# Patient Record
Sex: Male | Born: 1985 | Race: Black or African American | Hispanic: No | Marital: Single | Smoking: Current every day smoker
Health system: Southern US, Community
[De-identification: ages and names within clinical notes are randomized; demographics above are authoritative.]

## PROBLEM LIST (undated history)

## (undated) HISTORY — PX: LEG SURGERY: SHX1003

---
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
## 2007-07-26 ENCOUNTER — Emergency Department (HOSPITAL_COMMUNITY): Admission: EM | Admit: 2007-07-26 | Discharge: 2007-07-26 | Payer: Self-pay | Admitting: Family Medicine

## 2007-12-17 ENCOUNTER — Emergency Department (HOSPITAL_COMMUNITY): Admission: EM | Admit: 2007-12-17 | Discharge: 2007-12-17 | Payer: Self-pay | Admitting: Emergency Medicine

## 2009-01-16 ENCOUNTER — Emergency Department (HOSPITAL_COMMUNITY): Admission: EM | Admit: 2009-01-16 | Discharge: 2009-01-16 | Payer: Self-pay | Admitting: Family Medicine

## 2009-05-11 ENCOUNTER — Emergency Department (HOSPITAL_COMMUNITY): Admission: EM | Admit: 2009-05-11 | Discharge: 2009-05-11 | Payer: Self-pay | Admitting: Emergency Medicine

## 2010-10-26 LAB — RPR: RPR Ser Ql: NONREACTIVE

## 2010-10-26 LAB — GC/CHLAMYDIA PROBE AMP, GENITAL
Chlamydia, DNA Probe: POSITIVE — AB
GC Probe Amp, Genital: NEGATIVE

## 2010-10-26 LAB — HIV ANTIBODY (ROUTINE TESTING W REFLEX): HIV: NONREACTIVE

## 2013-07-09 ENCOUNTER — Emergency Department (INDEPENDENT_AMBULATORY_CARE_PROVIDER_SITE_OTHER)
Admission: EM | Admit: 2013-07-09 | Discharge: 2013-07-09 | Disposition: A | Discharge status: Home / Self Care | Payer: Self-pay | Source: Home / Self Care | Attending: Family Medicine | Admitting: Family Medicine

## 2013-07-09 ENCOUNTER — Encounter (HOSPITAL_COMMUNITY): Payer: Self-pay | Admitting: Emergency Medicine

## 2013-07-09 DIAGNOSIS — J111 Influenza due to unidentified influenza virus with other respiratory manifestations: Secondary | ICD-10-CM

## 2013-07-09 NOTE — ED Provider Notes (Signed)
CSN: [id number]     Arrival date & time 07/09/13  1945 History   First MD Initiated Contact with Patient 07/09/13 2022     No chief complaint on file.  (Consider location/radiation/quality/duration/timing/severity/associated sxs/prior Treatment) Patient is a 27 y.o. male presenting with fever. The history is provided by the patient and a parent.  Fever Temp source:  Subjective Severity:  Moderate Duration:  2 days Progression:  Unchanged Chronicity:  New Associated symptoms: chills, congestion, cough, myalgias and rhinorrhea   Associated symptoms: no diarrhea, no dysuria, no nausea, no rash and no vomiting     No past medical history on file. No past surgical history on file. No family history on file. History  Substance Use Topics  . Smoking status: Not on file  . Smokeless tobacco: Not on file  . Alcohol Use: Not on file    Review of Systems  Constitutional: Positive for fever and chills.  HENT: Positive for congestion, postnasal drip and rhinorrhea.   Respiratory: Positive for cough.   Cardiovascular: Negative.   Gastrointestinal: Negative.  Negative for nausea, vomiting and diarrhea.  Genitourinary: Negative.  Negative for dysuria.  Musculoskeletal: Positive for myalgias.  Skin: Negative for rash.    Allergies  Review of patient's allergies indicates not on file.  Home Medications  No current outpatient prescriptions on file. BP 145/83  Pulse 90  Temp(Src) 99.8 F (37.7 C) (Oral)  Resp 16  SpO2 99% Physical Exam  Nursing note and vitals reviewed. Constitutional: He is oriented to person, place, and time. He appears well-developed and well-nourished.  HENT:  Head: Normocephalic.  Right Ear: External ear normal.  Left Ear: External ear normal.  Mouth/Throat: Oropharynx is clear and moist.  Eyes: Pupils are equal, round, and reactive to light.  Neck: Normal range of motion. Neck supple.  Cardiovascular: Normal rate, regular rhythm, normal heart sounds and  intact distal pulses.   Pulmonary/Chest: Effort normal and breath sounds normal.  Abdominal: Soft. Bowel sounds are normal.  Lymphadenopathy:    He has no cervical adenopathy.  Neurological: He is alert and oriented to person, place, and time.  Skin: Skin is warm and dry.    ED Course  Procedures (including critical care time) Labs Review Labs Reviewed - No data to display Imaging Review No results found.  EKG Interpretation    Date/Time:    Ventricular Rate:    PR Interval:    QRS Duration:   QT Interval:    QTC Calculation:   R Axis:     Text Interpretation:              MDM      Linna Hoff, MD 07/09/13 2038

## 2013-07-09 NOTE — ED Notes (Signed)
Fever  (101.4), upper respiratory infection symptoms for 1-2 days.

## 2013-10-19 ENCOUNTER — Encounter (HOSPITAL_COMMUNITY): Payer: Self-pay | Admitting: Emergency Medicine

## 2013-10-19 ENCOUNTER — Emergency Department (INDEPENDENT_AMBULATORY_CARE_PROVIDER_SITE_OTHER)
Admission: EM | Admit: 2013-10-19 | Discharge: 2013-10-19 | Disposition: A | Discharge status: Home / Self Care | Payer: Self-pay | Source: Home / Self Care | Attending: Emergency Medicine | Admitting: Emergency Medicine

## 2013-10-19 DIAGNOSIS — B353 Tinea pedis: Secondary | ICD-10-CM

## 2013-10-19 MED ORDER — CLOTRIMAZOLE 1 % EX CREA: TOPICAL_CREAM | CUTANEOUS | Status: DC

## 2013-10-19 NOTE — Discharge Instructions (Signed)
Athlete's Foot Athlete's foot (tinea pedis) is a fungal infection of the skin on the feet. It often occurs on the skin between the toes or underneath the toes. It can also occur on the soles of the feet. Athlete's foot is more likely to occur in hot, humid weather. Not washing your feet or changing your socks often enough can contribute to athlete's foot. The infection can spread from person to person (contagious). CAUSES Athlete's foot is caused by a fungus. This fungus thrives in warm, moist places. Most people get athlete's foot by sharing shower stalls, towels, and wet floors with an infected person. People with weakened immune systems, including those with diabetes, may be more likely to get athlete's foot. SYMPTOMS   Itchy areas between the toes or on the soles of the feet.  White, flaky, or scaly areas between the toes or on the soles of the feet.  Tiny, intensely itchy blisters between the toes or on the soles of the feet.  Tiny cuts on the skin. These cuts can develop a bacterial infection.  Thick or discolored toenails. DIAGNOSIS  Your caregiver can usually tell what the problem is by doing a physical exam. Your caregiver may also take a skin sample from the rash area. The skin sample may be examined under a microscope, or it may be tested to see if fungus will grow in the sample. A sample may also be taken from your toenail for testing. TREATMENT  Over-the-counter and prescription medicines can be used to kill the fungus. These medicines are available as powders or creams. Your caregiver can suggest medicines for you. Fungal infections respond slowly to treatment. You may need to continue using your medicine for several weeks. PREVENTION   Do not share towels.  Wear sandals in wet areas, such as shared locker rooms and shared showers.  Keep your feet dry. Wear shoes that allow air to circulate. Wear cotton or wool socks. HOME CARE INSTRUCTIONS   Take medicines as directed by  your caregiver. Do not use steroid creams on athlete's foot.  Keep your feet clean and cool. Wash your feet daily and dry them thoroughly, especially between your toes.  Change your socks every day. Wear cotton or wool socks. In hot climates, you may need to change your socks 2 to 3 times per day.  Wear sandals or canvas tennis shoes with good air circulation.  If you have blisters, soak your feet in Burow's solution or Epsom salts for 20 to 30 minutes, 2 times a day to dry out the blisters. Make sure you dry your feet thoroughly afterward. SEEK MEDICAL CARE IF:   You have a fever.  You have swelling, soreness, warmth, or redness in your foot.  You are not getting better after 7 days of treatment.  You are not completely cured after 30 days.  You have any problems caused by your medicines. MAKE SURE YOU:   Understand these instructions.  Will watch your condition.  Will get help right away if you are not doing well or get worse. Document Released: 07/06/2000 Document Revised: 10/01/2011 Document Reviewed: 04/27/2011 [employer] Patient Information 2014 [employer].  

## 2013-10-19 NOTE — ED Notes (Signed)
C/o  Rash on both feet with irritation x 1 wk.  Pt has used cortisone cream with no relief.

## 2013-10-19 NOTE — ED Provider Notes (Signed)
Medical screening examination/treatment/procedure(s) were performed by non-physician practitioner and as supervising physician I was immediately available for consultation/collaboration.  Jamecia Lerman, M.D.  Jla Reynolds C Aracelis Ulrey, MD 10/19/13 2215 

## 2013-10-19 NOTE — ED Provider Notes (Signed)
CSN: [id number]     Arrival date & time 10/19/13  1046 History   First MD Initiated Contact with Patient 10/19/13 1208     Chief Complaint  Patient presents with  . Rash   (Consider location/radiation/quality/duration/timing/severity/associated sxs/prior Treatment) Patient is a 28 y.o. male presenting with rash. The history is provided by the patient. No language interpreter was used.  Rash Location:  Foot Foot rash location:  L foot and R foot Quality: blistering, itchiness and redness   Severity:  Moderate Onset quality:  Gradual Timing:  Constant Progression:  Worsening Chronicity:  New Relieved by:  Nothing Worsened by:  Nothing tried Ineffective treatments:  None tried Pt complains of rash on feet and between toes,  Pt reports feet are wet a lot at work  History reviewed. No pertinent past medical history. Past Surgical History  Procedure Laterality Date  . Leg surgery     History reviewed. No pertinent family history. History  Substance Use Topics  . Smoking status: Current Every Day Smoker  . Smokeless tobacco: Not on file  . Alcohol Use: No    Review of Systems  Skin: Positive for rash.  All other systems reviewed and are negative.    Allergies  Review of patient's allergies indicates no known allergies.  Home Medications   Current Outpatient Rx  Name  Route  Sig  Dispense  Refill  . guaiFENesin (ROBITUSSIN) 100 MG/5ML liquid   Oral   Take 200 mg by mouth 3 (three) times daily as needed for cough.         Marland Kitchen. ibuprofen (ADVIL,MOTRIN) 200 MG tablet   Oral   Take 200 mg by mouth every 6 (six) hours as needed.          BP 115/79  Pulse 100  Temp(Src) 97 F (36.1 C) (Oral)  Resp 16  SpO2 96% Physical Exam  Nursing note and vitals reviewed. Constitutional: He is oriented to person, place, and time. He appears well-developed and well-nourished.  Musculoskeletal: He exhibits tenderness.  White areas between toes,  Discolored areas feet   Neurological: He is alert and oriented to person, place, and time. He has normal reflexes.  Skin: There is erythema.  Psychiatric: He has a normal mood and affect.    ED Course  Procedures (including critical care time) Labs Review Labs Reviewed - No data to display Imaging Review No results found.   MDM   1. Tinea pedis    Rx for lotrimin    Elson AreasLeslie K Jalynn Waddell, PA-C 10/19/13 1252

## 2015-08-20 ENCOUNTER — Encounter (HOSPITAL_COMMUNITY): Payer: Self-pay | Admitting: *Deleted

## 2015-08-20 ENCOUNTER — Emergency Department (INDEPENDENT_AMBULATORY_CARE_PROVIDER_SITE_OTHER)
Admission: EM | Admit: 2015-08-20 | Discharge: 2015-08-20 | Disposition: A | Discharge status: Home / Self Care | Payer: Self-pay | Source: Home / Self Care | Attending: Family Medicine | Admitting: Family Medicine

## 2015-08-20 DIAGNOSIS — F17200 Nicotine dependence, unspecified, uncomplicated: Secondary | ICD-10-CM

## 2015-08-20 DIAGNOSIS — J069 Acute upper respiratory infection, unspecified: Secondary | ICD-10-CM

## 2015-08-20 DIAGNOSIS — J9801 Acute bronchospasm: Secondary | ICD-10-CM

## 2015-08-20 MED ORDER — ALBUTEROL SULFATE HFA 108 (90 BASE) MCG/ACT IN AERS: 2.0000 | INHALATION_SPRAY | RESPIRATORY_TRACT | Status: DC | PRN

## 2015-08-20 NOTE — ED Notes (Signed)
C/O nasal congestion and drainage x 1 wk.  Worse when waking in AM - starts off feeling very congested and a little feverish, then improves while working, then is bad again by end of work shift.  Has been taking Tylenol & IBU.  Denies cough.

## 2015-08-20 NOTE — ED Provider Notes (Signed)
CSN: [id number]     Arrival date & time 08/20/15  1757 History   First MD Initiated Contact with Patient 08/20/15 1902     Chief Complaint  Patient presents with  . Nasal Congestion   (Consider location/radiation/quality/duration/timing/severity/associated sxs/prior Treatment) HPI Comments: 30 year old male complaining of runny nose, PND, body aches and congestion. States he had a fever of 99.4 yesterday. Denies earache, sore throat.   History reviewed. No pertinent past medical history. Past Surgical History  Procedure Laterality Date  . Leg surgery     No family history on file. Social History  Substance Use Topics  . Smoking status: Current Every Day Smoker -- 1.00 packs/day    Types: Cigarettes  . Smokeless tobacco: None  . Alcohol Use: Yes     Comment: occasional    Review of Systems  Constitutional: Positive for activity change. Negative for diaphoresis and fatigue.  HENT: Positive for congestion and postnasal drip. Negative for ear pain, facial swelling, rhinorrhea, sore throat and trouble swallowing.   Eyes: Negative for pain, discharge and redness.  Respiratory: Positive for cough. Negative for chest tightness and shortness of breath.   Cardiovascular: Negative.   Gastrointestinal: Negative.   Musculoskeletal: Negative.  Negative for neck pain and neck stiffness.  Neurological: Negative.     Allergies  Review of patient's allergies indicates no known allergies.  Home Medications   Prior to Admission medications   Medication Sig Start Date End Date Taking? Authorizing Provider  albuterol (PROVENTIL HFA;VENTOLIN HFA) 108 (90 Base) MCG/ACT inhaler Inhale 2 puffs into the lungs every 4 (four) hours as needed for wheezing or shortness of breath. 08/20/15   Hayden Rasmussen, NP  clotrimazole (LOTRIMIN) 1 % cream Apply to affected area 2 times daily 10/19/13   Elson Areas, PA-C  guaiFENesin (ROBITUSSIN) 100 MG/5ML liquid Take 200 mg by mouth 3 (three) times daily as needed  for cough.    Historical Provider, MD  ibuprofen (ADVIL,MOTRIN) 200 MG tablet Take 200 mg by mouth every 6 (six) hours as needed.    Historical Provider, MD   Meds Ordered and Administered this Visit  Medications - No data to display  BP 131/86 mmHg  Pulse 79  Temp(Src) 98.5 F (36.9 C) (Oral)  Resp 18  SpO2 98% No data found.   Physical Exam  Constitutional: He is oriented to person, place, and time. He appears well-developed and well-nourished. No distress.  HENT:  Bilateral TMs are normal Oropharynx with minor erythema, cobblestoning and clear PND. No exudates.  Eyes: Conjunctivae and EOM are normal.  Neck: Normal range of motion. Neck supple.  Cardiovascular: Normal rate, regular rhythm and normal heart sounds.   Pulmonary/Chest: Effort normal and breath sounds normal. No respiratory distress.  Distant faint expiratory wheezes.  Musculoskeletal: Normal range of motion. He exhibits no edema.  Lymphadenopathy:    He has no cervical adenopathy.  Neurological: He is alert and oriented to person, place, and time.  Skin: Skin is warm and dry. No rash noted.  Psychiatric: He has a normal mood and affect.  Nursing note and vitals reviewed.   ED Course  Procedures (including critical care time)  Labs Review Labs Reviewed - No data to display  Imaging Review No results found.   Visual Acuity Review  Right Eye Distance:   Left Eye Distance:   Bilateral Distance:    Right Eye Near:   Left Eye Near:    Bilateral Near:         MDM  1. URI (upper respiratory infection)   2. Bronchospasm   3. Smoking addiction    Upper Respiratory Infection, Adult  For drainage take Allegra or Zyrtec or Claritin daily. At nighttime may take Chlor-Trimeton 2-4 mg.  For congestion take Sudafed PE 10 mg every 4 hours as needed Use saline nasal spray frequently Drink plenty of fluids stay well-hydrated Meds ordered this encounter  Medications  . albuterol (PROVENTIL  HFA;VENTOLIN HFA) 108 (90 Base) MCG/ACT inhaler    Sig: Inhale 2 puffs into the lungs every 4 (four) hours as needed for wheezing or shortness of breath.    Dispense:  1 Inhaler    Refill:  0    Order Specific Question:  Supervising Provider    Answer:  Linna Hoff [[id number]]       Hayden Rasmussen, NP 08/20/15 1933

## 2015-08-20 NOTE — Discharge Instructions (Signed)
Bronchospasm, Adult A bronchospasm is a spasm or tightening of the airways going into the lungs. During a bronchospasm breathing becomes more difficult because the airways get smaller. When this happens there can be coughing, a whistling sound when breathing (wheezing), and difficulty breathing. Bronchospasm is often associated with asthma, but not all patients who experience a bronchospasm have asthma. CAUSES  A bronchospasm is caused by inflammation or irritation of the airways. The inflammation or irritation may be triggered by:   Allergies (such as to animals, pollen, food, or mold). Allergens that cause bronchospasm may cause wheezing immediately after exposure or many hours later.   Infection. Viral infections are believed to be the most common cause of bronchospasm.   Exercise.   Irritants (such as pollution, cigarette smoke, strong odors, aerosol sprays, and paint fumes).   Weather changes. Winds increase molds and pollens in the air. Rain refreshes the air by washing irritants out. Cold air may cause inflammation.   Stress and emotional upset.  SIGNS AND SYMPTOMS   Wheezing.   Excessive nighttime coughing.   Frequent or severe coughing with a simple cold.   Chest tightness.   Shortness of breath.  DIAGNOSIS  Bronchospasm is usually diagnosed through a history and physical exam. Tests, such as chest X-rays, are sometimes done to look for other conditions. TREATMENT   Inhaled medicines can be given to open up your airways and help you breathe. The medicines can be given using either an inhaler or a nebulizer machine.  Corticosteroid medicines may be given for severe bronchospasm, usually when it is associated with asthma. HOME CARE INSTRUCTIONS   Always have a plan prepared for seeking medical care. Know when to call your health care provider and local emergency services (911 in the [place] Know where you can access local emergency care.  Only take medicines as  directed by your health care provider.  If you were prescribed an inhaler or nebulizer machine, ask your health care provider to explain how to use it correctly. Always use a spacer with your inhaler if you were given one.  It is necessary to remain calm during an attack. Try to relax and breathe more slowly.  Control your home environment in the following ways:   Change your heating and air conditioning filter at least once a month.   Limit your use of fireplaces and wood stoves.  Do not smoke and do not allow smoking in your home.   Avoid exposure to perfumes and fragrances.   Get rid of pests (such as roaches and mice) and their droppings.   Throw away plants if you see mold on them.   Keep your house clean and dust free.   Replace carpet with wood, tile, or vinyl flooring. Carpet can trap dander and dust.   Use allergy-proof pillows, mattress covers, and box spring covers.   Wash bed sheets and blankets every week in hot water and dry them in a dryer.   Use blankets that are made of polyester or cotton.   Wash hands frequently. SEEK MEDICAL CARE IF:   You have muscle aches.   You have chest pain.   The sputum changes from clear or white to yellow, green, gray, or bloody.   The sputum you cough up gets thicker.   There are problems that may be related to the medicine you are given, such as a rash, itching, swelling, or trouble breathing.  SEEK IMMEDIATE MEDICAL CARE IF:   You have worsening wheezing and coughing  even after taking your prescribed medicines.   You have increased difficulty breathing.   You develop severe chest pain. MAKE SURE YOU:   Understand these instructions.  Will watch your condition.  Will get help right away if you are not doing well or get worse.   This information is not intended to replace advice given to you by your health care provider. Make sure you discuss any questions you have with your health care  provider.   Document Released: 07/12/2003 Document Revised: 07/30/2014 Document Reviewed: 12/29/2012 Elsevier Interactive Patient Education 2016 [employer].  How to Use an Inhaler Using your inhaler correctly is very important. Good technique will make sure that the medicine reaches your lungs.  HOW TO USE AN INHALER:  Take the cap off the inhaler.  If this is the first time using your inhaler, you need to prime it. Shake the inhaler for 5 seconds. Release four puffs into the air, away from your face. Ask your doctor for help if you have questions.  Shake the inhaler for 5 seconds.  Turn the inhaler so the bottle is above the mouthpiece.  Put your pointer finger on top of the bottle. Your thumb holds the bottom of the inhaler.  Open your mouth.  Either hold the inhaler away from your mouth (the width of 2 fingers) or place your lips tightly around the mouthpiece. Ask your doctor which way to use your inhaler.  Breathe out as much air as possible.  Breathe in and push down on the bottle 1 time to release the medicine. You will feel the medicine go in your mouth and throat.  Continue to take a deep breath in very slowly. Try to fill your lungs.  After you have breathed in completely, hold your breath for 10 seconds. This will help the medicine to settle in your lungs. If you cannot hold your breath for 10 seconds, hold it for as long as you can before you breathe out.  Breathe out slowly, through pursed lips. Whistling is an example of pursed lips.  If your doctor has told you to take more than 1 puff, wait at least 15-30 seconds between puffs. This will help you get the best results from your medicine. Do not use the inhaler more than your doctor tells you to.  Put the cap back on the inhaler.  Follow the directions from your doctor or from the inhaler package about cleaning the inhaler. If you use more than one inhaler, ask your doctor which inhalers to use and what order to  use them in. Ask your doctor to help you figure out when you will need to refill your inhaler.  If you use a steroid inhaler, always rinse your mouth with water after your last puff, gargle and spit out the water. Do not swallow the water. GET HELP IF:  The inhaler medicine only partially helps to stop wheezing or shortness of breath.  You are having trouble using your inhaler.  You have some increase in thick spit (phlegm). GET HELP RIGHT AWAY IF:  The inhaler medicine does not help your wheezing or shortness of breath or you have tightness in your chest.  You have dizziness, headaches, or fast heart rate.  You have chills, fever, or night sweats.  You have a large increase of thick spit, or your thick spit is bloody. MAKE SURE YOU:   Understand these instructions.  Will watch your condition.  Will get help right away if you are not  doing well or get worse.   This information is not intended to replace advice given to you by your health care provider. Make sure you discuss any questions you have with your health care provider.   Document Released: 04/17/2008 Document Revised: 04/29/2013 Document Reviewed: 02/05/2013 Elsevier Interactive Patient Education 2016 [employer] Inc[employer]  Upper Respiratory Infection, Adult  For drainage take Allegra or Zyrtec or Claritin daily. At nighttime may take Chlor-Trimeton 2-4 mg.  For congestion take Sudafed PE 10 mg every 4 hours as needed Use saline nasal spray frequently Drink plenty of fluids stay well-hydrated Most upper respiratory infections (URIs) are a viral infection of the air passages leading to the lungs. A URI affects the nose, throat, and upper air passages. The most common type of URI is nasopharyngitis and is typically referred to as "the common cold." URIs run their course and usually go away on their own. Most of the time, a URI does not require medical attention, but sometimes a bacterial infection in the upper airways can follow  a viral infection. This is called a secondary infection. Sinus and middle ear infections are common types of secondary upper respiratory infections. Bacterial pneumonia can also complicate a URI. A URI can worsen asthma and chronic obstructive pulmonary disease (COPD). Sometimes, these complications can require emergency medical care and may be life threatening.  CAUSES Almost all URIs are caused by viruses. A virus is a type of germ and can spread from one person to another.  RISKS FACTORS You may be at risk for a URI if:   You smoke.   You have chronic heart or lung disease.  You have a weakened defense (immune) system.   You are very young or very old.   You have nasal allergies or asthma.  You work in crowded or poorly ventilated areas.  You work in health care facilities or schools. SIGNS AND SYMPTOMS  Symptoms typically develop 2-3 days after you come in contact with a cold virus. Most viral URIs last 7-10 days. However, viral URIs from the influenza virus (flu virus) can last 14-18 days and are typically more severe. Symptoms may include:   Runny or stuffy (congested) nose.   Sneezing.   Cough.   Sore throat.   Headache.   Fatigue.   Fever.   Loss of appetite.   Pain in your forehead, behind your eyes, and over your cheekbones (sinus pain).  Muscle aches.  DIAGNOSIS  Your health care provider may diagnose a URI by:  Physical exam.  Tests to check that your symptoms are not due to another condition such as:  Strep throat.  Sinusitis.  Pneumonia.  Asthma. TREATMENT  A URI goes away on its own with time. It cannot be cured with medicines, but medicines may be prescribed or recommended to relieve symptoms. Medicines may help:  Reduce your fever.  Reduce your cough.  Relieve nasal congestion. HOME CARE INSTRUCTIONS   Take medicines only as directed by your health care provider.   Gargle warm saltwater or take cough drops to comfort  your throat as directed by your health care provider.  Use a warm mist humidifier or inhale steam from a shower to increase air moisture. This may make it easier to breathe.  Drink enough fluid to keep your urine clear or pale yellow.   Eat soups and other clear broths and maintain good nutrition.   Rest as needed.   Return to work when your temperature has returned to normal or as  your health care provider advises. You may need to stay home longer to avoid infecting others. You can also use a face mask and careful hand washing to prevent spread of the virus.  Increase the usage of your inhaler if you have asthma.   Do not use any tobacco products, including cigarettes, chewing tobacco, or electronic cigarettes. If you need help quitting, ask your health care provider. PREVENTION  The best way to protect yourself from getting a cold is to practice good hygiene.   Avoid oral or hand contact with people with cold symptoms.   Wash your hands often if contact occurs.  There is no clear evidence that vitamin C, vitamin E, echinacea, or exercise reduces the chance of developing a cold. However, it is always recommended to get plenty of rest, exercise, and practice good nutrition.  SEEK MEDICAL CARE IF:   You are getting worse rather than better.   Your symptoms are not controlled by medicine.   You have chills.  You have worsening shortness of breath.  You have brown or red mucus.  You have yellow or brown nasal discharge.  You have pain in your face, especially when you bend forward.  You have a fever.  You have swollen neck glands.  You have pain while swallowing.  You have white areas in the back of your throat. SEEK IMMEDIATE MEDICAL CARE IF:   You have severe or persistent:  Headache.  Ear pain.  Sinus pain.  Chest pain.  You have chronic lung disease and any of the following:  Wheezing.  Prolonged cough.  Coughing up blood.  A change in your  usual mucus.  You have a stiff neck.  You have changes in your:  Vision.  Hearing.  Thinking.  Mood. MAKE SURE YOU:   Understand these instructions.  Will watch your condition.  Will get help right away if you are not doing well or get worse.   This information is not intended to replace advice given to you by your health care provider. Make sure you discuss any questions you have with your health care provider.   Document Released: 01/02/2001 Document Revised: 11/23/2014 Document Reviewed: 10/14/2013 Elsevier Interactive Patient Education [employer]

## 2016-06-21 DIAGNOSIS — Y929 Unspecified place or not applicable: Secondary | ICD-10-CM | POA: Insufficient documentation

## 2016-06-21 DIAGNOSIS — R930 Abnormal findings on diagnostic imaging of skull and head, not elsewhere classified: Secondary | ICD-10-CM | POA: Insufficient documentation

## 2016-06-21 DIAGNOSIS — S01411A Laceration without foreign body of right cheek and temporomandibular area, initial encounter: Secondary | ICD-10-CM | POA: Insufficient documentation

## 2016-06-21 DIAGNOSIS — F17210 Nicotine dependence, cigarettes, uncomplicated: Secondary | ICD-10-CM | POA: Insufficient documentation

## 2016-06-21 DIAGNOSIS — S0181XA Laceration without foreign body of other part of head, initial encounter: Secondary | ICD-10-CM | POA: Insufficient documentation

## 2016-06-21 DIAGNOSIS — Y939 Activity, unspecified: Secondary | ICD-10-CM | POA: Insufficient documentation

## 2016-06-21 DIAGNOSIS — Y999 Unspecified external cause status: Secondary | ICD-10-CM | POA: Insufficient documentation

## 2016-06-22 ENCOUNTER — Emergency Department (HOSPITAL_COMMUNITY): Payer: Self-pay

## 2016-06-22 ENCOUNTER — Encounter (HOSPITAL_COMMUNITY): Payer: Self-pay | Admitting: Emergency Medicine

## 2016-06-22 ENCOUNTER — Emergency Department (HOSPITAL_COMMUNITY)
Admission: EM | Admit: 2016-06-22 | Discharge: 2016-06-22 | Disposition: A | Discharge status: Home / Self Care | Payer: Self-pay | Attending: Emergency Medicine | Admitting: Emergency Medicine

## 2016-06-22 DIAGNOSIS — S0181XA Laceration without foreign body of other part of head, initial encounter: Secondary | ICD-10-CM

## 2016-06-22 MED ORDER — LIDOCAINE-EPINEPHRINE (PF) 2 %-1:200000 IJ SOLN
20.0000 mL | Freq: Once | INTRAMUSCULAR | Status: AC
  Administered 2016-06-22: 20 mL
  Filled 2016-06-22: qty 20

## 2016-06-22 NOTE — ED Provider Notes (Signed)
LACERATION REPAIR Performed by: Elpidio AnisUPSTILL, Athziri Freundlich A Authorized by: Elpidio AnisUPSTILL, Ahava Kissoon A Consent: Verbal consent obtained. Risks and benefits: risks, benefits and alternatives were discussed Consent given by: patient Patient identity confirmed: provided demographic data Prepped and Draped in normal sterile fashion Wound explored  Laceration Location: right cheek  Laceration Length: 6 cm  No Foreign Bodies seen or palpated  Anesthesia: local infiltration  Local anesthetic: lidocaine 1% w/epinephrine  Anesthetic total: 3 ml  Irrigation method: syringe Amount of cleaning: standard  Skin closure: 6-0 vicryl, 6-0 prolene  Number of sutures: 5 SQ vicryl, 16 prolene  Technique: inverted (vicryl), simple interrupted (prolene)  Patient tolerance: Patient tolerated the procedure well with no immediate complications.   LACERATION REPAIR Performed by: Elpidio AnisUPSTILL, Yarixa Lightcap A Authorized by: Elpidio AnisUPSTILL, Coren Crownover A Consent: Verbal consent obtained. Risks and benefits: risks, benefits and alternatives were discussed Consent given by: patient Patient identity confirmed: provided demographic data Prepped and Draped in normal sterile fashion Wound explored  Laceration Location: right nasolabial fold  Laceration Length: 1cm  No Foreign Bodies seen or palpated  Anesthesia: local infiltration  Local anesthetic: lidocaine 1% w/epinephrine  Anesthetic total: 1 ml  Irrigation method: syringe Amount of cleaning: standard  Skin closure: 6-0 prolene  Number of sutures: 3  Technique: simple interrupted  Patient tolerance: Patient tolerated the procedure well with no immediate complications.    Elpidio AnisShari Michel Hendon, PA-C 06/22/16 0505    Glynn OctaveStephen Rancour, MD 06/22/16 [phone]
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
## 2016-06-22 NOTE — Discharge Instructions (Signed)
Follow up for suture removal in 5-7 days. Return to the ED if you develop new or worsening symptoms.
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
## 2016-07-02 ENCOUNTER — Ambulatory Visit (HOSPITAL_COMMUNITY)
Admission: EM | Admit: 2016-07-02 | Discharge: 2016-07-02 | Disposition: A | Discharge status: Home / Self Care | Payer: Self-pay | Attending: Family Medicine | Admitting: Family Medicine

## 2016-07-02 ENCOUNTER — Encounter (HOSPITAL_COMMUNITY): Payer: Self-pay | Admitting: *Deleted

## 2016-07-02 DIAGNOSIS — Z4802 Encounter for removal of sutures: Secondary | ICD-10-CM

## 2016-07-02 NOTE — Discharge Instructions (Signed)
Keep wound clean and wash with soap and water daily. If he sees any signs of infection sig medical attention promptly.

## 2016-07-02 NOTE — ED Provider Notes (Signed)
CSN: [id number]     Arrival date & time 07/02/16  1447 History   First MD Initiated Contact with Patient 07/02/16 1617     Chief Complaint  Patient presents with  . Suture / Staple Removal   (Consider location/radiation/quality/duration/timing/severity/associated sxs/prior Treatment) 30 year old male presents to the urgent care for suture removal. Sutures were placed in the emergency department 10 days ago. He states that he had various excuses such as sleeping too later having a work to make an earlier to have sutures removed as the appropriate time. No complaints.      History reviewed. No pertinent past medical history. Past Surgical History:  Procedure Laterality Date  . LEG SURGERY     History reviewed. No pertinent family history. Social History  Substance Use Topics  . Smoking status: Current Every Day Smoker    Packs/day: 1.00    Types: Cigarettes  . Smokeless tobacco: Never Used  . Alcohol use Yes     Comment: occasional    Review of Systems  Skin:       As per history of present illness  All other systems reviewed and are negative.   Allergies  Patient has no known allergies.  Home Medications   Prior to Admission medications   Medication Sig Start Date End Date Taking? Authorizing Provider  albuterol (PROVENTIL HFA;VENTOLIN HFA) 108 (90 Base) MCG/ACT inhaler Inhale 2 puffs into the lungs every 4 (four) hours as needed for wheezing or shortness of breath. 08/20/15   Hayden Rasmussenavid Yandiel Bergum, NP  clotrimazole (LOTRIMIN) 1 % cream Apply to affected area 2 times daily 10/19/13   Elson AreasLeslie K Sofia, PA-C  guaiFENesin (ROBITUSSIN) 100 MG/5ML liquid Take 200 mg by mouth 3 (three) times daily as needed for cough.    Historical Provider, MD  ibuprofen (ADVIL,MOTRIN) 200 MG tablet Take 200 mg by mouth every 6 (six) hours as needed.    Historical Provider, MD   Meds Ordered and Administered this Visit  Medications - No data to display  BP 115/86 (BP Location: Right Arm)   Pulse 78    Temp 98.6 F (37 C) (Oral)   Resp 18   SpO2 100%  No data found.   Physical Exam  Constitutional: He is oriented to person, place, and time. He appears well-developed and well-nourished. No distress.  Neurological: He is alert and oriented to person, place, and time.  Skin: Skin is warm and dry.  Wound across the right upper face and right nares healing well. No signs of infection. No drainage. No erythema.  Nursing note and vitals reviewed.   Urgent Care Course   Clinical Course     .Suture Removal Date/Time: 07/02/2016 4:40 PM Performed by: Phineas RealMABE, Shareen Capwell Authorized by: Elvina SidleLAUENSTEIN, KURT   Consent:    Consent obtained:  Verbal   Consent given by:  Patient   Risks discussed:  Pain Location:    Location:  Head/neck   Head/neck location:  Cheek   Cheek location:  R cheek Procedure details:    Wound appearance:  Good wound healing and clean   Number of sutures removed:  18 Post-procedure details:    Patient tolerance of procedure:  Tolerated well, no immediate complications    (including critical care time)  Labs Review Labs Reviewed - No data to display  Imaging Review No results found.   Visual Acuity Review  Right Eye Distance:   Left Eye Distance:   Bilateral Distance:    Right Eye Near:   Left Eye Near:  Bilateral Near:         MDM   1. Visit for suture removal   Keep wound clean and wash with soap and water daily. If he sees any signs of infection sig medical attention promptly.      Hayden Rasmussenavid Roshanda Balazs, NP 07/02/16 1643    Hayden Rasmussenavid Raynette Arras, NP 07/02/16 [phone]

## 2016-07-02 NOTE — ED Triage Notes (Signed)
Pt    Here  For  Suture   Removal  Face        Appears  Well  Healing

## 2018-07-31 ENCOUNTER — Encounter (HOSPITAL_COMMUNITY): Payer: Self-pay | Admitting: Emergency Medicine

## 2018-07-31 ENCOUNTER — Other Ambulatory Visit: Payer: Self-pay

## 2018-07-31 ENCOUNTER — Ambulatory Visit (HOSPITAL_COMMUNITY)
Admission: EM | Admit: 2018-07-31 | Discharge: 2018-07-31 | Disposition: A | Discharge status: Home / Self Care | Payer: Self-pay | Attending: Family Medicine | Admitting: Family Medicine

## 2018-07-31 DIAGNOSIS — L509 Urticaria, unspecified: Secondary | ICD-10-CM | POA: Insufficient documentation

## 2018-07-31 MED ORDER — PREDNISONE 10 MG (21) PO TBPK: ORAL_TABLET | Freq: Every day | ORAL | 0 refills | Status: DC
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
## 2018-07-31 NOTE — ED Provider Notes (Signed)
[facility]   [id number] 07/31/18 Arrival Time: 1015  ASSESSMENT & PLAN:  1. Hives     Meds ordered this encounter  Medications  . predniSONE (STERAPRED UNI-PAK 21 TAB) 10 MG (21) TBPK tablet    Sig: Take by mouth daily. Take as directed.    Dispense:  21 tablet    Refill:  0   Benadryl if needed. Will follow up with PCP or here if worsening or failing to improve as anticipated. Reviewed expectations re: course of current medical issues. Questions answered. Outlined signs and symptoms indicating need for more acute intervention. Patient verbalized understanding. After Visit Summary given.   SUBJECTIVE:  Henry Mcneil is a 33 y.o. male who presents with a skin complaint.   Location: face and neck Onset: abrupt Duration: over the past two weeks after going to barber shop for a shave; aftershave applied just before symptoms/rash started Associated pruritis? Yes Associated pain? none Progression: fluctuating a bit  Drainage? No  Contacts with similar? No Recent travel? No  Other associated symptoms: none Therapies tried thus far: none Arthralgia or myalgia? none Recent illness? none Fever? none No specific aggravating or alleviating factors reported.  ROS: As per HPI.  OBJECTIVE: Vitals:   07/31/18 1039  BP: 131/84  Pulse: 93  Resp: 16  Temp: 97.8 F (36.6 C)  TempSrc: Oral  SpO2: 100%    General appearance: alert; no distress Lungs: clear to auscultation bilaterally Heart: regular rate and rhythm Extremities: no edema Skin: warm and dry; signs of infection: no; smooth, slightly elevated and erythematous plaques of variable size over his face and neck Psychological: alert and cooperative; normal mood and affect  No Known Allergies   Social History   Socioeconomic History  . Marital status: Single    Spouse name: Not on file  . Number of children: Not on file  . Years of education: Not on file  . Highest education level: Not on file    Occupational History  . Not on file  Social Needs  . Financial resource strain: Not on file  . Food insecurity:    Worry: Not on file    Inability: Not on file  . Transportation needs:    Medical: Not on file    Non-medical: Not on file  Tobacco Use  . Smoking status: Current Every Day Smoker    Packs/day: 1.00    Types: Cigarettes  . Smokeless tobacco: Never Used  Substance and Sexual Activity  . Alcohol use: Yes    Comment: occasional  . Drug use: No  . Sexual activity: Never  Lifestyle  . Physical activity:    Days per week: Not on file    Minutes per session: Not on file  . Stress: Not on file  Relationships  . Social connections:    Talks on phone: Not on file    Gets together: Not on file    Attends religious service: Not on file    Active member of club or organization: Not on file    Attends meetings of clubs or organizations: Not on file    Relationship status: Not on file  . Intimate partner violence:    Fear of current or ex partner: Not on file    Emotionally abused: Not on file    Physically abused: Not on file    Forced sexual activity: Not on file  Other Topics Concern  . Not on file  Social History Narrative  . Not on file  Past Surgical History:  Procedure Laterality Date  . LEG SURGERY       Mardella Layman, MD 08/05/18 [phone]

## 2018-08-15 ENCOUNTER — Encounter (HOSPITAL_COMMUNITY): Payer: Self-pay

## 2018-08-15 ENCOUNTER — Ambulatory Visit (HOSPITAL_COMMUNITY)
Admission: EM | Admit: 2018-08-15 | Discharge: 2018-08-15 | Disposition: A | Discharge status: Home / Self Care | Payer: Self-pay | Attending: Family Medicine | Admitting: Family Medicine

## 2018-08-15 DIAGNOSIS — L2489 Irritant contact dermatitis due to other agents: Secondary | ICD-10-CM | POA: Insufficient documentation

## 2018-08-15 MED ORDER — TRIAMCINOLONE ACETONIDE 0.1 % EX OINT: 1.0000 "application " | TOPICAL_OINTMENT | Freq: Two times a day (BID) | CUTANEOUS | 1 refills | Status: DC

## 2018-08-15 MED ORDER — METHYLPREDNISOLONE ACETATE 80 MG/ML IJ SUSP
80.0000 mg | Freq: Once | INTRAMUSCULAR | Status: AC
  Administered 2018-08-15: 80 mg via INTRAMUSCULAR

## 2018-08-15 MED ORDER — METHYLPREDNISOLONE ACETATE 80 MG/ML IJ SUSP
INTRAMUSCULAR | Status: AC
  Filled 2018-08-15: qty 1

## 2018-08-15 NOTE — Discharge Instructions (Signed)
Use the ointment 2 x a day Take benadryl if needed for itching at night Take zyrtec if needed for itching during day STOP USING KEY CHAIN

## 2018-08-15 NOTE — ED Provider Notes (Signed)
[facility]    CSN: [id number] Arrival date & time: 08/15/18  1420     History   Chief Complaint Chief Complaint  Patient presents with  . Allergic Reaction    HPI Henry Mcneil is a 33 y.o. male.   HPI See note from January 9. Patient was seen for contact dermatitis.  He states he is always been using a lanyard and his neck.  He continues uses a manual.  He is here today for another rash.  He has an itchy papular area all around his neck for the [place].  Couple small patches on the face as well.  It itches moderately.  He is otherwise in good health.  He states he does not usually have eczema or sensitive skin. History reviewed. No pertinent past medical history.  There are no active problems to display for this patient.   Past Surgical History:  Procedure Laterality Date  . LEG SURGERY         Home Medications    Prior to Admission medications   Medication Sig Start Date End Date Taking? Authorizing Provider  albuterol (PROVENTIL HFA;VENTOLIN HFA) 108 (90 Base) MCG/ACT inhaler Inhale 2 puffs into the lungs every 4 (four) hours as needed for wheezing or shortness of breath. 08/20/15   Hayden RasmussenMabe, David, NP  clotrimazole (LOTRIMIN) 1 % cream Apply to affected area 2 times daily 10/19/13   Sofia, Leslie K, PA-C  guaiFENesin (ROBITUSSIN) 100 MG/5ML liquid Take 200 mg by mouth 3 (three) times daily as needed for cough.    [provider]  ibuprofen (ADVIL,MOTRIN) 200 MG tablet Take 200 mg by mouth every 6 (six) hours as needed.    [provider]  predniSONE (STERAPRED UNI-PAK 21 TAB) 10 MG (21) TBPK tablet Take by mouth daily. Take as directed. 07/31/18   Mardella LaymanHagler, Brian, MD  triamcinolone ointment (KENALOG) 0.1 % Apply 1 application topically 2 (two) times daily. 08/15/18   Eustace MooreNelson, Yvonne Sue, MD    Family History Family History  Problem Relation Age of Onset  . Healthy Mother     Social History Social History   Tobacco Use  .  Smoking status: Current Every Day Smoker    Packs/day: 1.00    Types: Cigarettes  . Smokeless tobacco: Never Used  Substance Use Topics  . Alcohol use: Yes    Comment: occasional  . Drug use: No     Allergies   Patient has no known allergies.   Review of Systems Review of Systems  Constitutional: Negative for chills and fever.  HENT: Negative for ear pain and sore throat.   Eyes: Negative for pain and visual disturbance.  Respiratory: Negative for cough and shortness of breath.   Cardiovascular: Negative for chest pain and palpitations.  Gastrointestinal: Negative for abdominal pain and vomiting.  Genitourinary: Negative for dysuria and hematuria.  Musculoskeletal: Negative for arthralgias and back pain.  Skin: Positive for rash. Negative for color change.  Neurological: Negative for seizures and syncope.  All other systems reviewed and are negative.    Physical Exam Triage Vital Signs ED Triage Vitals  Enc Vitals Group     BP 08/15/18 1601 137/83     Pulse Rate 08/15/18 1601 77     Resp 08/15/18 1601 18     Temp 08/15/18 1601 98.1 F (36.7 C)     Temp Source 08/15/18 1601 Oral     SpO2 08/15/18 1601 100 %     Weight --  No data found.  Updated Vital Signs BP 137/83 (BP Location: Right Arm)   Pulse 77   Temp 98.1 F (36.7 C) (Oral)   Resp 18   SpO2 100%       Physical Exam Constitutional:      General: He is not in acute distress.    Appearance: He is well-developed.  HENT:     Head: Normocephalic and atraumatic.  Eyes:     Conjunctiva/sclera: Conjunctivae normal.     Pupils: Pupils are equal, round, and reactive to light.  Neck:     Musculoskeletal: Normal range of motion.   Cardiovascular:     Rate and Rhythm: Normal rate.  Pulmonary:     Effort: Pulmonary effort is normal. No respiratory distress.  Abdominal:     General: There is no distension.     Palpations: Abdomen is soft.  Musculoskeletal: Normal range of motion.  Skin:     General: Skin is warm and dry.  Neurological:     Mental Status: He is alert.      UC Treatments / Results  Labs (all labs ordered are listed, but only abnormal results are displayed) Labs Reviewed - No data to display  EKG None  Radiology No results found.  Procedures Procedures (including critical care time)  Medications Ordered in UC Medications  methylPREDNISolone acetate (DEPO-MEDROL) injection 80 mg (has no administration in time range)    Initial Impression / Assessment and Plan / UC Course  I have reviewed the triage vital signs and the nursing notes.  Pertinent labs & imaging results that were available during my care of the patient were reviewed by me and considered in my medical decision making (see chart for details).     He has contact dermatitis around the back of his neck that is in the correct distribution to be from a necklace or lanyard.  He was treated for this before.  He did not stop using the neck strap, and has recurrence of the rash.  I emphasized to him today he must throw away his old key lanyard and get something none irritating like cotton Final Clinical Impressions(s) / UC Diagnoses   Final diagnoses:  Irritant contact dermatitis due to other agents     Discharge Instructions     Use the ointment 2 x a day Take benadryl if needed for itching at night Take zyrtec if needed for itching during day STOP USING KEY CHAIN   ED Prescriptions    Medication Sig Dispense Auth. Provider   triamcinolone ointment (KENALOG) 0.1 % Apply 1 application topically 2 (two) times daily. 30 g Eustace Moore, MD     Controlled Substance Prescriptions [place] Controlled Substance Registry consulted? Not Applicable   Eustace Moore, MD 08/15/18 2042

## 2018-08-15 NOTE — ED Triage Notes (Signed)
Pt presents with allergic reaction to a rope that he carries around his neck at work; pt was previously treated for same thing with predisone pack but was drinking alcohol while taking medication and it was not effective.

## 2018-08-16 ENCOUNTER — Ambulatory Visit (HOSPITAL_COMMUNITY)
Admission: EM | Admit: 2018-08-16 | Discharge: 2018-08-16 | Disposition: A | Discharge status: Home / Self Care | Payer: Self-pay | Attending: Internal Medicine | Admitting: Internal Medicine

## 2018-08-16 ENCOUNTER — Other Ambulatory Visit: Payer: Self-pay

## 2018-08-16 ENCOUNTER — Encounter (HOSPITAL_COMMUNITY): Payer: Self-pay | Admitting: Emergency Medicine

## 2018-08-16 DIAGNOSIS — R21 Rash and other nonspecific skin eruption: Secondary | ICD-10-CM | POA: Insufficient documentation

## 2018-08-16 MED ORDER — PREDNISONE 10 MG (21) PO TBPK: ORAL_TABLET | Freq: Every day | ORAL | 0 refills | Status: DC

## 2018-08-16 NOTE — Discharge Instructions (Addendum)
Use some kind of cream in a jar after showering and leaving some water beads on your skin to help it absorb. Stop bathing every day while the rash is present and shower with luke warm water every other day.  You may re-apply the cream as needed ones the skin scabs are healing.   For itching you may take Claritin or Benadryl.

## 2018-08-16 NOTE — ED Triage Notes (Signed)
The patient presented to the [facility] with a complaint of hives x 3 weeks. The patient being evaluated yesterday at the [facility].
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
## 2018-09-04 NOTE — ED Provider Notes (Signed)
[facility]    CSN: [id number] Arrival date & time: 08/16/18  1307     History   Chief Complaint Chief Complaint  Patient presents with  . Urticaria    HPI Henry Mcneil is a 33 y.o. male. who present with a rash on his neck from  lanyard his co-worker made for him and was seen yesterday at this clinic and given Triamcinolone cream, but he is getting worse. He has continued wearing it.    History reviewed. No pertinent past medical history.  There are no active problems to display for this patient.   Past Surgical History:  Procedure Laterality Date  . LEG SURGERY         Home Medications    Prior to Admission medications   Medication Sig Start Date End Date Taking? Authorizing Provider  predniSONE (STERAPRED UNI-PAK 21 TAB) 10 MG (21) TBPK tablet Take by mouth daily. Take as directed. 08/16/18   Rodriguez-Southworth, Nettie Elm, PA-C  triamcinolone ointment (KENALOG) 0.1 % Apply 1 application topically 2 (two) times daily. 08/15/18   Eustace Moore, MD    Family History Family History  Problem Relation Age of Onset  . Healthy Mother     Social History Social History   Tobacco Use  . Smoking status: Current Every Day Smoker    Packs/day: 1.00    Types: Cigarettes  . Smokeless tobacco: Never Used  Substance Use Topics  . Alcohol use: Yes    Comment: occasional  . Drug use: No     Allergies   Patient has no known allergies.   Review of Systems Review of Systems  Constitutional: Negative for chills, diaphoresis and fever.  HENT: Negative for trouble swallowing.   Respiratory: Negative for shortness of breath and wheezing.   Musculoskeletal: Negative for arthralgias and joint swelling.  Skin: Positive for rash.       + pruritus   Physical Exam Triage Vital Signs ED Triage Vitals  Enc Vitals Group     BP 08/16/18 1412 130/88     Pulse Rate 08/16/18 1412 74     Resp 08/16/18 1412 18     Temp 08/16/18 1412 98.3 F (36.8 C)   Temp Source 08/16/18 1412 Oral     SpO2 08/16/18 1412 99 %     Weight --      Height --      Head Circumference --      Peak Flow --      Pain Score 08/16/18 1411 0     Pain Loc --      Pain Edu? --      Excl. in GC? --    No data found.  Updated Vital Signs BP 130/88 (BP Location: Right Arm)   Pulse 74   Temp 98.3 F (36.8 C) (Oral)   Resp 18   SpO2 99%   Visual Acuity Right Eye Distance:   Left Eye Distance:   Bilateral Distance:    Right Eye Near:   Left Eye Near:    Bilateral Near:     Physical Exam Vitals signs and nursing note reviewed.  Constitutional:      Appearance: He is normal weight.  HENT:     Right Ear: External ear normal.  Eyes:     General: No scleral icterus.    Conjunctiva/sclera: Conjunctivae normal.  Neck:     Musculoskeletal: Neck supple.  Cardiovascular:     Rate and Rhythm: Normal rate and regular rhythm.  Pulmonary:  Effort: Pulmonary effort is normal.     Breath sounds: Normal breath sounds.  Musculoskeletal: Normal range of motion.  Lymphadenopathy:     Cervical: No cervical adenopathy.  Skin:    General: Skin is warm and dry.     Coloration: Skin is not jaundiced.     Findings: Rash present. No bruising or erythema.     Comments: Skin with fine papular rash which feel rough and dry around his neck, upper chest and slightly on lower face, neck and shoulder and mild on forearms.   Neurological:     Mental Status: He is alert and oriented to person, place, and time.  Psychiatric:        Mood and Affect: Mood normal.        Behavior: Behavior normal.        Thought Content: Thought content normal.        Judgment: Judgment normal.    UC Treatments / Results  Labs (all labs ordered are listed, but only abnormal results are displayed) Labs Reviewed - No data to display  EKG None  Radiology No results found.  Procedures Procedures   Medications Ordered in UC Medications - No data to display  Initial Impression /  Assessment and Plan / UC Course  I have reviewed the triage vital signs and the nursing notes. I believe he has contact dermatitis, with some eczema like rash. He decided to throw away his lanyard. I placed him on Prednisone taper, since he did not want a shot.  Fu with his PCP if recurrence.    Final Clinical Impressions(s) / UC Diagnoses   Final diagnoses:  Rash and nonspecific skin eruption     Discharge Instructions     Use some kind of cream in a jar after showering and leaving some water beads on your skin to help it absorb. Stop bathing every day while the rash is present and shower with luke warm water every other day.  You may re-apply the cream as needed ones the skin scabs are healing.   For itching you may take Claritin or Benadryl.     ED Prescriptions    Medication Sig Dispense Auth. Provider   predniSONE (STERAPRED UNI-PAK 21 TAB) 10 MG (21) TBPK tablet Take by mouth daily. Take as directed. 21 tablet Rodriguez-Southworth, Nettie ElmSylvia, PA-C     Controlled Substance Prescriptions [place] Controlled Substance Registry consulted?    Garey HamRodriguez-Southworth, Hervey Wedig, PA-C 09/04/18 1419

## 2018-12-18 ENCOUNTER — Other Ambulatory Visit: Payer: Self-pay

## 2018-12-18 ENCOUNTER — Emergency Department (HOSPITAL_COMMUNITY)
Admission: EM | Admit: 2018-12-18 | Discharge: 2018-12-18 | Disposition: A | Discharge status: Home / Self Care | Payer: Self-pay | Attending: Emergency Medicine | Admitting: Emergency Medicine

## 2018-12-18 ENCOUNTER — Encounter (HOSPITAL_COMMUNITY): Payer: Self-pay | Admitting: Emergency Medicine

## 2018-12-18 DIAGNOSIS — Y929 Unspecified place or not applicable: Secondary | ICD-10-CM | POA: Insufficient documentation

## 2018-12-18 DIAGNOSIS — Y939 Activity, unspecified: Secondary | ICD-10-CM | POA: Insufficient documentation

## 2018-12-18 DIAGNOSIS — T161XXA Foreign body in right ear, initial encounter: Secondary | ICD-10-CM | POA: Insufficient documentation

## 2018-12-18 DIAGNOSIS — X58XXXA Exposure to other specified factors, initial encounter: Secondary | ICD-10-CM | POA: Insufficient documentation

## 2018-12-18 DIAGNOSIS — Y999 Unspecified external cause status: Secondary | ICD-10-CM | POA: Insufficient documentation

## 2018-12-18 DIAGNOSIS — F17210 Nicotine dependence, cigarettes, uncomplicated: Secondary | ICD-10-CM | POA: Insufficient documentation

## 2018-12-18 NOTE — Discharge Instructions (Signed)
Please read attached information. If you experience any new or worsening signs or symptoms please return to the emergency room for evaluation.  °

## 2018-12-18 NOTE — ED Provider Notes (Signed)
[facility] Provider Note   CSN: [id number] Arrival date & time: 12/18/18  1244    History   Chief Complaint No chief complaint on file.   HPI Henry Mcneil is a 33 y.o. male.     HPI   33 year old male presents today with foreign body in his right ear.  Patient notes he was listening to headphones when the earbud got stuck in his ear.  He tried to remove it but was unsuccessful.  He notes pain in the ear.  This was approximately 20 minutes prior to arrival.  History reviewed. No pertinent past medical history.  There are no active problems to display for this patient.   Past Surgical History:  Procedure Laterality Date  . LEG SURGERY          Home Medications    Prior to Admission medications   Medication Sig Start Date End Date Taking? Authorizing Provider  predniSONE (STERAPRED UNI-PAK 21 TAB) 10 MG (21) TBPK tablet Take by mouth daily. Take as directed. 08/16/18   Rodriguez-Southworth, Nettie ElmSylvia, PA-C  triamcinolone ointment (KENALOG) 0.1 % Apply 1 application topically 2 (two) times daily. 08/15/18   Eustace MooreNelson, Yvonne Sue, MD    Family History Family History  Problem Relation Age of Onset  . Healthy Mother     Social History Social History   Tobacco Use  . Smoking status: Current Every Day Smoker    Packs/day: 1.00    Types: Cigarettes  . Smokeless tobacco: Never Used  Substance Use Topics  . Alcohol use: Yes    Comment: occasional  . Drug use: No     Allergies   Patient has no known allergies.   Review of Systems Review of Systems  All other systems reviewed and are negative.    Physical Exam Updated Vital Signs BP (!) 140/97 (BP Location: Right Arm)   Pulse 87   Temp 97.6 F (36.4 C) (Oral)   Resp 20   SpO2 98%   Physical Exam Vitals signs and nursing note reviewed.  Constitutional:      Appearance: He is well-developed.  HENT:     Head: Normocephalic and atraumatic.     Comments: Black  piece of rubber lodged in the right external auditory canal Eyes:     General: No scleral icterus.       Right eye: No discharge.        Left eye: No discharge.     Conjunctiva/sclera: Conjunctivae normal.     Pupils: Pupils are equal, round, and reactive to light.  Neck:     Musculoskeletal: Normal range of motion.     Vascular: No JVD.     Trachea: No tracheal deviation.  Pulmonary:     Effort: Pulmonary effort is normal.     Breath sounds: No stridor.  Neurological:     Mental Status: He is alert and oriented to person, place, and time.     Coordination: Coordination normal.  Psychiatric:        Behavior: Behavior normal.        Thought Content: Thought content normal.        Judgment: Judgment normal.      ED Treatments / Results  Labs (all labs ordered are listed, but only abnormal results are displayed) Labs Reviewed - No data to display  EKG None  Radiology No results found.  Procedures .Foreign Body Removal Date/Time: 12/18/2018 1:26 PM Performed by: Eyvonne MechanicHedges, Shandria Clinch, PA-C Authorized by: Eyvonne MechanicHedges, Jessina Marse,  PA-C  Consent: Verbal consent obtained. Consent given by: patient Patient understanding: patient states understanding of the procedure being performed Patient identity confirmed: verbally with patient Body area: ear Location details: right ear  Sedation: Patient sedated: no  Patient restrained: no Localization method: ENT speculum Removal mechanism: alligator forceps Complexity: simple 1 objects recovered. Objects recovered: Silicone earbud Post-procedure assessment: foreign body removed Patient tolerance: Patient tolerated the procedure well with no immediate complications   (including critical care time)  Medications Ordered in ED Medications - No data to display   Initial Impression / Assessment and Plan / ED Course  I have reviewed the triage vital signs and the nursing notes.  Pertinent labs & imaging results that were available during  my care of the patient were reviewed by me and considered in my medical decision making (see chart for details).        Patient with a earbud lodged in his right ear.  This is removed without complication.  Mild erythema in the canal, no signs of infection, hearing normal no subsequent pain.  Discharged with return precautions.  Verbalized understanding and agreement to today's plan.  Final Clinical Impressions(s) / ED Diagnoses   Final diagnoses:  Foreign body of right ear, initial encounter    ED Discharge Orders    None       Rosalio Loud 12/18/18 1328    Blane Ohara, MD 12/18/18 [phone]

## 2018-12-18 NOTE — ED Notes (Signed)
Discharge instructions discussed with Pt. Pt verbalized understanding. Pt stable and ambulatory.    

## 2018-12-18 NOTE — ED Triage Notes (Signed)
Pt here via [facility], rubber piece off headphone stuck in right ear, used bobby pin but pushed it farther in, in deep per EMS but able to view. No pain, just irritation, hearing still present.

## 2019-01-14 ENCOUNTER — Encounter (HOSPITAL_COMMUNITY): Payer: Self-pay | Admitting: Emergency Medicine

## 2019-01-14 ENCOUNTER — Ambulatory Visit (HOSPITAL_COMMUNITY)
Admission: EM | Admit: 2019-01-14 | Discharge: 2019-01-14 | Disposition: A | Discharge status: Home / Self Care | Payer: Self-pay | Attending: Family Medicine | Admitting: Family Medicine

## 2019-01-14 DIAGNOSIS — W503XXA Accidental bite by another person, initial encounter: Secondary | ICD-10-CM

## 2019-01-14 DIAGNOSIS — S70371A Other superficial bite of right thigh, initial encounter: Secondary | ICD-10-CM

## 2019-01-14 DIAGNOSIS — S60571A Other superficial bite of hand of right hand, initial encounter: Secondary | ICD-10-CM

## 2019-01-14 DIAGNOSIS — S40272A Other superficial bite of left shoulder, initial encounter: Secondary | ICD-10-CM
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
## 2019-05-06 ENCOUNTER — Ambulatory Visit (HOSPITAL_COMMUNITY)
Admission: EM | Admit: 2019-05-06 | Discharge: 2019-05-06 | Disposition: A | Discharge status: Home / Self Care | Payer: Self-pay | Attending: Family Medicine | Admitting: Family Medicine

## 2019-05-06 ENCOUNTER — Other Ambulatory Visit: Payer: Self-pay

## 2019-05-06 ENCOUNTER — Encounter (HOSPITAL_COMMUNITY): Payer: Self-pay | Admitting: Emergency Medicine

## 2019-05-06 DIAGNOSIS — R0981 Nasal congestion: Secondary | ICD-10-CM | POA: Insufficient documentation

## 2019-05-06 DIAGNOSIS — Z20828 Contact with and (suspected) exposure to other viral communicable diseases: Secondary | ICD-10-CM | POA: Insufficient documentation

## 2019-05-06 DIAGNOSIS — Z1159 Encounter for screening for other viral diseases: Secondary | ICD-10-CM

## 2019-05-06 DIAGNOSIS — J069 Acute upper respiratory infection, unspecified: Secondary | ICD-10-CM | POA: Insufficient documentation

## 2019-05-06 DIAGNOSIS — J029 Acute pharyngitis, unspecified: Secondary | ICD-10-CM | POA: Insufficient documentation

## 2019-05-06 DIAGNOSIS — F17210 Nicotine dependence, cigarettes, uncomplicated: Secondary | ICD-10-CM | POA: Insufficient documentation

## 2019-05-06 DIAGNOSIS — R5383 Other fatigue: Secondary | ICD-10-CM | POA: Insufficient documentation

## 2019-05-06 NOTE — ED Triage Notes (Signed)
Pt c/o fatigue, congestion, throat irritation, feeling "not himself. Symptoms since Friday. No fever.

## 2019-05-06 NOTE — Discharge Instructions (Signed)
I do recommend a daily Zyrtec (or generic form), mucinex to loosen secretions, or daily Flonase to help with congestion.  Tylenol and/or ibuprofen as needed for pain or fevers.  Rest.  Self isolate until covid results are back and negative.  Will notify you of any positive findings. You may monitor your results on your MyChart online as well.   Negative testing will be through your MyChart.

## 2019-05-06 NOTE — ED Provider Notes (Signed)
[facility]    CSN: [id number] Arrival date & time: 05/06/19  1007      History   Chief Complaint Chief Complaint  Patient presents with  . Fatigue  . Nasal Congestion    HPI Henry Mcneil is a 33 y.o. male.   Henry Mcneil presents with complaints of symptoms which started 10/9. Took two benadryl because he felt like he was experiencing allergies. History  Of allergies, tend to flare with weather changes. Sore throat started first. Felt more sluggish. Fatigued. Congestion. Sore throat resolved and then developed facial pressure and congestion. Yesterday still with mucus congestion. Decreased appetite. Today feels the same. Sluggish. He feels a little better. No cough. No fevers or chills. Body aches. No headache. No ear pain. Ibuprofen helped some. No known ill contacts. Works in [employer]. No vomiting or diarrhea. Decreased taste a few days ago, back now. No loss of smell. Without contributing medical history.  Overall starting to feel improved and feels similar to other illness he has, but felt he needed time off work to rest.     ROS per HPI, negative if not otherwise mentioned.      History reviewed. No pertinent past medical history.  There are no active problems to display for this patient.   Past Surgical History:  Procedure Laterality Date  . LEG SURGERY         Home Medications    Prior to Admission medications   Not on File    Family History Family History  Problem Relation Age of Onset  . Healthy Mother     Social History Social History   Tobacco Use  . Smoking status: Current Every Day Smoker    Packs/day: 1.00    Types: Cigarettes  . Smokeless tobacco: Never Used  Substance Use Topics  . Alcohol use: Yes    Comment: occasional  . Drug use: No     Allergies   Patient has no known allergies.   Review of Systems Review of Systems   Physical Exam Triage Vital Signs ED Triage Vitals  Enc Vitals Group   BP 05/06/19 1111 138/86     Pulse Rate 05/06/19 1111 83     Resp --      Temp 05/06/19 1111 98.1 F (36.7 C)     Temp src --      SpO2 05/06/19 1111 98 %     Weight --      Height --      Head Circumference --      Peak Flow --      Pain Score 05/06/19 1110 0     Pain Loc --      Pain Edu? --      Excl. in GC? --    No data found.  Updated Vital Signs BP 138/86   Pulse 83   Temp 98.1 F (36.7 C)   SpO2 98%    Physical Exam Constitutional:      Appearance: He is well-developed.  HENT:     Nose: Congestion present.     Mouth/Throat:     Mouth: Mucous membranes are moist.  Cardiovascular:     Rate and Rhythm: Normal rate.  Pulmonary:     Effort: Pulmonary effort is normal.  Skin:    General: Skin is warm and dry.  Neurological:     Mental Status: He is alert and oriented to person, place, and time.      UC Treatments /  Results  Labs (all labs ordered are listed, but only abnormal results are displayed) Labs Reviewed  NOVEL CORONAVIRUS, NAA (HOSP ORDER, SEND-OUT TO REF LAB; TAT 18-24 HRS)    EKG   Radiology No results found.  Procedures Procedures (including critical care time)  Medications Ordered in UC Medications - No data to display  Initial Impression / Assessment and Plan / UC Course  I have reviewed the triage vital signs and the nursing notes.  Pertinent labs & imaging results that were available during my care of the patient were reviewed by me and considered in my medical decision making (see chart for details).     Non toxic. Benign physical exam.  Afebrile. History and physical consistent with viral illness.  Supportive cares recommended. covid testing pending. Will notify of any positive findings and if any changes to treatment are needed.  Return precautions provided. If symptoms worsen or do not improve in the next week to return to be seen or to follow up with PCP.  Patient verbalized understanding and agreeable to plan.   Final  Clinical Impressions(s) / UC Diagnoses   Final diagnoses:  Viral URI     Discharge Instructions     I do recommend a daily Zyrtec (or generic form), mucinex to loosen secretions, or daily Flonase to help with congestion.  Tylenol and/or ibuprofen as needed for pain or fevers.  Rest.  Self isolate until covid results are back and negative.  Will notify you of any positive findings. You may monitor your results on your MyChart online as well.   Negative testing will be through your MyChart.     ED Prescriptions    None     PDMP not reviewed this encounter.   Zigmund Gottron, NP 05/06/19 1205

## 2019-05-10 LAB — NOVEL CORONAVIRUS, NAA (HOSP ORDER, SEND-OUT TO REF LAB; TAT 18-24 HRS): SARS-CoV-2, NAA: NOT DETECTED

## 2019-11-04 ENCOUNTER — Other Ambulatory Visit: Payer: Self-pay

## 2019-11-16 ENCOUNTER — Other Ambulatory Visit: Payer: Self-pay

## 2019-11-16 ENCOUNTER — Encounter (HOSPITAL_COMMUNITY): Payer: Self-pay

## 2019-11-16 ENCOUNTER — Ambulatory Visit (HOSPITAL_COMMUNITY)
Admission: EM | Admit: 2019-11-16 | Discharge: 2019-11-16 | Disposition: A | Discharge status: Home / Self Care | Payer: HRSA Program | Attending: Family Medicine | Admitting: Family Medicine

## 2019-11-16 DIAGNOSIS — Z20822 Contact with and (suspected) exposure to covid-19: Secondary | ICD-10-CM

## 2019-11-16 NOTE — ED Triage Notes (Signed)
Patient reports his mother tested positive for covid, and wants to get tested to make sure he does not have it either.

## 2019-11-16 NOTE — ED Provider Notes (Signed)
[facility]    CSN: [id number] Arrival date & time: 11/16/19  1045      History   Chief Complaint Chief Complaint  Patient presents with  . Covid Exposure    HPI Henry Mcneil is a 34 y.o. male no significant past medical history presenting today for Covid testing after exposure.  Patient notes that this morning when he woke up he was feeling very fatigued.  He notes that his mom tested positive for Covid over a week ago and wanted to be screened for Covid to make sure he is safe to return to work.  He states that the fatigue has slightly improved with drinking water.  He also reports frequent alcohol intake which may be causing some dehydration contributing to his symptoms.  He denies any nausea vomiting or abdominal pain.  Denies URI symptoms.  Denies fevers chills or body aches.  HPI  History reviewed. No pertinent past medical history.  There are no problems to display for this patient.   Past Surgical History:  Procedure Laterality Date  . LEG SURGERY         Home Medications    Prior to Admission medications   Not on File    Family History Family History  Problem Relation Age of Onset  . Healthy Mother     Social History Social History   Tobacco Use  . Smoking status: Current Every Day Smoker    Packs/day: 1.00    Types: Cigarettes  . Smokeless tobacco: Never Used  Substance Use Topics  . Alcohol use: Yes    Comment: occasional  . Drug use: No     Allergies   Patient has no known allergies.   Review of Systems Review of Systems  Constitutional: Positive for fatigue. Negative for activity change, appetite change, chills and fever.  HENT: Negative for congestion, ear pain, rhinorrhea, sinus pressure, sore throat and trouble swallowing.   Eyes: Negative for discharge and redness.  Respiratory: Negative for cough, chest tightness and shortness of breath.   Cardiovascular: Negative for chest pain.  Gastrointestinal: Negative for  abdominal pain, diarrhea, nausea and vomiting.  Musculoskeletal: Negative for myalgias.  Skin: Negative for rash.  Neurological: Negative for dizziness, light-headedness and headaches.     Physical Exam Triage Vital Signs ED Triage Vitals  Enc Vitals Group     BP 11/16/19 1101 121/79     Pulse Rate 11/16/19 1101 88     Resp 11/16/19 1101 16     Temp 11/16/19 1101 98.5 F (36.9 C)     Temp Source 11/16/19 1101 Oral     SpO2 11/16/19 1101 100 %     Weight --      Height --      Head Circumference --      Peak Flow --      Pain Score 11/16/19 1059 0     Pain Loc --      Pain Edu? --      Excl. in GC? --    No data found.  Updated Vital Signs BP 121/79 (BP Location: Left Arm)   Pulse 88   Temp 98.5 F (36.9 C) (Oral)   Resp 16   SpO2 100%   Visual Acuity Right Eye Distance:   Left Eye Distance:   Bilateral Distance:    Right Eye Near:   Left Eye Near:    Bilateral Near:     Physical Exam Vitals and nursing note reviewed.  Constitutional:  Appearance: He is well-developed.     Comments: No acute distress  HENT:     Head: Normocephalic and atraumatic.     Nose: Nose normal.     Mouth/Throat:     Comments: Oral mucosa pink and moist, no tonsillar enlargement or exudate. Posterior pharynx patent and nonerythematous, no uvula deviation or swelling. Normal phonation. Eyes:     Conjunctiva/sclera: Conjunctivae normal.  Cardiovascular:     Rate and Rhythm: Normal rate.  Pulmonary:     Effort: Pulmonary effort is normal. No respiratory distress.     Comments: Breathing comfortably at rest, CTABL, no wheezing, rales or other adventitious sounds auscultated Abdominal:     General: There is no distension.  Musculoskeletal:        General: Normal range of motion.     Cervical back: Neck supple.  Skin:    General: Skin is warm and dry.  Neurological:     Mental Status: He is alert and oriented to person, place, and time.      UC Treatments / Results   Labs (all labs ordered are listed, but only abnormal results are displayed) Labs Reviewed  SARS CORONAVIRUS 2 (TAT 6-24 HRS)    EKG   Radiology No results found.  Procedures Procedures (including critical care time)  Medications Ordered in UC Medications - No data to display  Initial Impression / Assessment and Plan / UC Course  I have reviewed the triage vital signs and the nursing notes.  Pertinent labs & imaging results that were available during my care of the patient were reviewed by me and considered in my medical decision making (see chart for details).     Covid PCR pending.  Fatigue slightly improved after drinking water. Relatively asymptomatic. Recommending continuing to focus on fluid intake and resting.  Monitor for results.  May return to work if negative.  Discussed strict return precautions. Patient verbalized understanding and is agreeable with plan.  Final Clinical Impressions(s) / UC Diagnoses   Final diagnoses:  Exposure to COVID-19 virus   Discharge Instructions   None    ED Prescriptions    None     PDMP not reviewed this encounter.   Janith Lima, [place] 11/16/19 1115

## 2019-11-17 LAB — SARS CORONAVIRUS 2 (TAT 6-24 HRS): SARS Coronavirus 2: NEGATIVE

## 2019-11-24 ENCOUNTER — Ambulatory Visit: Payer: Self-pay | Admitting: Family Medicine

## 2020-01-04 ENCOUNTER — Ambulatory Visit: Payer: Self-pay | Admitting: Family Medicine

## 2020-01-05 ENCOUNTER — Ambulatory Visit: Payer: Self-pay | Admitting: Family Medicine

## 2020-04-12 ENCOUNTER — Encounter (HOSPITAL_COMMUNITY): Payer: Self-pay

## 2020-04-12 ENCOUNTER — Other Ambulatory Visit: Payer: Self-pay

## 2020-04-12 ENCOUNTER — Ambulatory Visit (HOSPITAL_COMMUNITY)
Admission: EM | Admit: 2020-04-12 | Discharge: 2020-04-12 | Disposition: A | Discharge status: Home / Self Care | Payer: Self-pay | Attending: Family Medicine | Admitting: Family Medicine

## 2020-04-12 DIAGNOSIS — S0993XA Unspecified injury of face, initial encounter: Secondary | ICD-10-CM
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
## 2020-05-11 ENCOUNTER — Telehealth: Payer: Self-pay | Admitting: Nurse Practitioner

## 2020-05-23 ENCOUNTER — Telehealth: Payer: Self-pay | Admitting: General Practice

## 2020-05-23 ENCOUNTER — Telehealth (INDEPENDENT_AMBULATORY_CARE_PROVIDER_SITE_OTHER): Payer: Self-pay | Admitting: General Practice

## 2020-05-23 NOTE — Telephone Encounter (Signed)
I return Pt call, unable to LVM since is not set up, but the PT need to be see by the PCP first before he can apply for the CAFA and OC program

## 2020-05-23 NOTE — Telephone Encounter (Signed)
Copied from CRM [id number]. Topic: General - Other >> May 23, 2020  9:09 AM Dalphine Handing A wrote: Pateint would like a callback from Mikle Bosworth is in regards to financial assistance program

## 2020-08-05 ENCOUNTER — Ambulatory Visit: Payer: Self-pay | Admitting: Family Medicine

## 2020-10-05 ENCOUNTER — Encounter (HOSPITAL_COMMUNITY): Payer: Self-pay

## 2020-10-05 ENCOUNTER — Other Ambulatory Visit: Payer: Self-pay

## 2020-10-05 ENCOUNTER — Ambulatory Visit (HOSPITAL_COMMUNITY)
Admission: EM | Admit: 2020-10-05 | Discharge: 2020-10-05 | Disposition: A | Discharge status: Home / Self Care | Payer: Self-pay | Attending: Physician Assistant | Admitting: Physician Assistant

## 2020-10-05 ENCOUNTER — Ambulatory Visit (INDEPENDENT_AMBULATORY_CARE_PROVIDER_SITE_OTHER): Payer: Self-pay

## 2020-10-05 DIAGNOSIS — R1032 Left lower quadrant pain: Secondary | ICD-10-CM

## 2020-10-05 DIAGNOSIS — R0789 Other chest pain: Secondary | ICD-10-CM

## 2020-10-05 MED ORDER — METHOCARBAMOL 500 MG PO TABS: 500.0000 mg | ORAL_TABLET | Freq: Four times a day (QID) | ORAL | 0 refills | Status: AC

## 2020-10-05 MED ORDER — IBUPROFEN 800 MG PO TABS: 800.0000 mg | ORAL_TABLET | Freq: Three times a day (TID) | ORAL | 0 refills | Status: DC | PRN

## 2020-10-05 NOTE — Discharge Instructions (Signed)
Return if any problems.  Follow up with Orthopaedist for recheck in 1 week  

## 2020-10-05 NOTE — ED Triage Notes (Signed)
Pt in with c/o left shoulder and hip pain that has been going on for awhile and is getting worse  Pt has been taking ibuprofen for relief

## 2020-10-06 NOTE — ED Provider Notes (Signed)
[facility]    CSN: [id number] Arrival date & time: 10/05/20  1114      History   Chief Complaint Chief Complaint  Patient presents with  . Shoulder Pain  . Hip Pain    HPI Henry Mcneil is a 35 y.o. male.   Pt complains of chronic leg pain, left  shoulder pain and rib pain  Pt reports he feels like something is out of place.    The history is provided by the patient.  Shoulder Pain Location:  Shoulder Shoulder location:  R shoulder and L shoulder Pain details:    Quality:  Aching Dislocation: no   Foreign body present:  No foreign bodies Prior injury to area:  Unable to specify Hip Pain    History reviewed. No pertinent past medical history.  There are no problems to display for this patient.   Past Surgical History:  Procedure Laterality Date  . LEG SURGERY         Home Medications    Prior to Admission medications   Medication Sig Start Date End Date Taking? Authorizing Provider  ibuprofen (ADVIL) 800 MG tablet Take 1 tablet (800 mg total) by mouth every 8 (eight) hours as needed. 10/05/20  Yes Cheron Schaumann K, PA-C  methocarbamol (ROBAXIN) 500 MG tablet Take 1 tablet (500 mg total) by mouth 4 (four) times daily. 10/05/20  Yes Elson Areas, PA-C    Family History Family History  Problem Relation Age of Onset  . Healthy Mother   . Heart failure Father     Social History Social History   Tobacco Use  . Smoking status: Current Every Day Smoker    Packs/day: 1.00    Types: Cigarettes  . Smokeless tobacco: Never Used  Substance Use Topics  . Alcohol use: Yes    Comment: occasional  . Drug use: Not Currently    Types: Marijuana     Allergies   Patient has no known allergies.   Review of Systems Review of Systems  All other systems reviewed and are negative.    Physical Exam Triage Vital Signs ED Triage Vitals  Enc Vitals Group     BP 10/05/20 1157 139/89     Pulse Rate 10/05/20 1157 93     Resp 10/05/20 1157 20      Temp 10/05/20 1157 98.2 F (36.8 C)     Temp src --      SpO2 10/05/20 1157 100 %     Weight --      Height --      Head Circumference --      Peak Flow --      Pain Score 10/05/20 1156 7     Pain Loc --      Pain Edu? --      Excl. in GC? --    No data found.  Updated Vital Signs BP 139/89   Pulse 93   Temp 98.2 F (36.8 C)   Resp 20   SpO2 100%   Visual Acuity Right Eye Distance:   Left Eye Distance:   Bilateral Distance:    Right Eye Near:   Left Eye Near:    Bilateral Near:     Physical Exam Vitals and nursing note reviewed.  Constitutional:      Appearance: He is well-developed.  HENT:     Head: Normocephalic and atraumatic.  Eyes:     Conjunctiva/sclera: Conjunctivae normal.  Cardiovascular:     Rate and Rhythm:  Normal rate and regular rhythm.     Heart sounds: No murmur heard.   Pulmonary:     Effort: Pulmonary effort is normal. No respiratory distress.     Breath sounds: Normal breath sounds.     Comments: Tender left lateral chest, and below left scapula  Abdominal:     Palpations: Abdomen is soft.     Tenderness: There is no abdominal tenderness.  Musculoskeletal:     Cervical back: Neck supple.  Skin:    General: Skin is warm and dry.  Neurological:     Mental Status: He is alert.      UC Treatments / Results  Labs (all labs ordered are listed, but only abnormal results are displayed) Labs Reviewed - No data to display  EKG   Radiology DG Chest 2 View  Result Date: 10/05/2020 CLINICAL DATA:  Pain about the left scapula and left flank for 6 months. No known injury. EXAM: CHEST - 2 VIEW COMPARISON:  PA and lateral chest 07/26/2007. FINDINGS: The lungs are clear. Heart size is normal. There is no pneumothorax or pleural fluid. No bony abnormality. IMPRESSION: Negative chest. Electronically Signed   By: Drusilla Kanner M.D.   On: 10/05/2020 12:43    Procedures Procedures (including critical care time)  Medications Ordered in  UC Medications - No data to display  Initial Impression / Assessment and Plan / UC Course  I have reviewed the triage vital signs and the nursing notes.  Pertinent labs & imaging results that were available during my care of the patient were reviewed by me and considered in my medical decision making (see chart for details).     MDM:  Xray normal.  Pt advised to follow up with Ortho if symptoms persist Final Clinical Impressions(s) / UC Diagnoses   Final diagnoses:  Chest wall pain     Discharge Instructions     Return if any problems.  Follow up with Orthopaedist for recheck in 1 week    ED Prescriptions    Medication Sig Dispense Auth. Provider   methocarbamol (ROBAXIN) 500 MG tablet Take 1 tablet (500 mg total) by mouth 4 (four) times daily. 20 tablet Chidiebere Wynn K, [place]   ibuprofen (ADVIL) 800 MG tablet Take 1 tablet (800 mg total) by mouth every 8 (eight) hours as needed. 30 tablet Elson Areas, [place]     PDMP not reviewed this encounter.  An After Visit Summary was printed and given to the patient.    Elson Areas, [place] 10/06/20 1039

## 2021-01-02 ENCOUNTER — Ambulatory Visit: Payer: Self-pay | Attending: Nurse Practitioner | Admitting: Nurse Practitioner

## 2021-01-02 ENCOUNTER — Other Ambulatory Visit: Payer: Self-pay

## 2021-02-21 ENCOUNTER — Encounter (HOSPITAL_COMMUNITY): Payer: Self-pay | Admitting: Emergency Medicine

## 2021-02-21 ENCOUNTER — Ambulatory Visit (HOSPITAL_COMMUNITY)
Admission: EM | Admit: 2021-02-21 | Discharge: 2021-02-21 | Disposition: A | Discharge status: Home / Self Care | Payer: Self-pay | Attending: Emergency Medicine | Admitting: Emergency Medicine

## 2021-02-21 ENCOUNTER — Other Ambulatory Visit: Payer: Self-pay

## 2021-02-21 DIAGNOSIS — U071 COVID-19: Secondary | ICD-10-CM | POA: Insufficient documentation

## 2021-02-21 DIAGNOSIS — J069 Acute upper respiratory infection, unspecified: Secondary | ICD-10-CM | POA: Insufficient documentation

## 2021-02-21 NOTE — Discharge Instructions (Addendum)

## 2021-02-21 NOTE — ED Provider Notes (Signed)
[facility]    CSN: [id number] Arrival date & time: 02/21/21  1451      History   Chief Complaint Chief Complaint  Patient presents with   Fever   Night Sweats   Fatigue    HPI Henry Mcneil is a 35 y.o. male.   Patient here for evaluation of congestion, fever, and fatigue that started yesterday.  Reports sister had similar symptoms but denies any known sick contacts.  Reports taking TheraFlu with relief.  Denies any trauma, injury, or other precipitating event.  Denies any specific alleviating or aggravating factors.  Denies any fevers, chest pain, shortness of breath, N/V/D, numbness, tingling, weakness, abdominal pain, or headaches.    The history is provided by the patient.  Fever Associated symptoms: congestion   Associated symptoms: no chest pain and no cough    History reviewed. No pertinent past medical history.  There are no problems to display for this patient.   Past Surgical History:  Procedure Laterality Date   LEG SURGERY         Home Medications    Prior to Admission medications   Medication Sig Start Date End Date Taking? Authorizing Provider  ibuprofen (ADVIL) 800 MG tablet Take 1 tablet (800 mg total) by mouth every 8 (eight) hours as needed. 10/05/20   Elson Areas, PA-C  methocarbamol (ROBAXIN) 500 MG tablet Take 1 tablet (500 mg total) by mouth 4 (four) times daily. 10/05/20   Elson Areas, PA-C    Family History Family History  Problem Relation Age of Onset   Healthy Mother    Heart failure Father     Social History Social History   Tobacco Use   Smoking status: Every Day    Packs/day: 1.00    Types: Cigarettes   Smokeless tobacco: Never  Substance Use Topics   Alcohol use: Yes    Comment: occasional   Drug use: Not Currently    Types: Marijuana     Allergies   Patient has no known allergies.   Review of Systems Review of Systems  Constitutional:  Positive for fatigue and fever.  HENT:  Positive for  congestion.   Respiratory:  Negative for cough.   Cardiovascular:  Negative for chest pain.    Physical Exam Triage Vital Signs ED Triage Vitals  Enc Vitals Group     BP 02/21/21 1606 116/75     Pulse Rate 02/21/21 1606 83     Resp 02/21/21 1606 18     Temp 02/21/21 1607 98.4 F (36.9 C)     Temp src --      SpO2 02/21/21 1606 97 %     Weight --      Height --      Head Circumference --      Peak Flow --      Pain Score 02/21/21 1607 0     Pain Loc --      Pain Edu? --      Excl. in GC? --    No data found.  Updated Vital Signs BP 116/75   Pulse 83   Temp 98.4 F (36.9 C)   Resp 18   SpO2 97%   Visual Acuity Right Eye Distance:   Left Eye Distance:   Bilateral Distance:    Right Eye Near:   Left Eye Near:    Bilateral Near:     Physical Exam Vitals and nursing note reviewed.  Constitutional:  General: He is not in acute distress.    Appearance: Normal appearance. He is not ill-appearing, toxic-appearing or diaphoretic.  HENT:     Head: Normocephalic and atraumatic.     Nose: Nose normal.     Mouth/Throat:     Mouth: Mucous membranes are moist.     Pharynx: Oropharynx is clear. No oropharyngeal exudate.  Eyes:     Conjunctiva/sclera: Conjunctivae normal.  Cardiovascular:     Rate and Rhythm: Normal rate.     Pulses: Normal pulses.     Heart sounds: Normal heart sounds.  Pulmonary:     Effort: Pulmonary effort is normal.     Breath sounds: Normal breath sounds.  Abdominal:     General: Abdomen is flat.  Musculoskeletal:        General: Normal range of motion.     Cervical back: Normal range of motion.  Skin:    General: Skin is warm and dry.  Neurological:     General: No focal deficit present.     Mental Status: He is alert and oriented to person, place, and time.  Psychiatric:        Mood and Affect: Mood normal.     UC Treatments / Results  Labs (all labs ordered are listed, but only abnormal results are displayed) Labs Reviewed   SARS CORONAVIRUS 2 (TAT 6-24 HRS)    EKG   Radiology No results found.  Procedures Procedures (including critical care time)  Medications Ordered in UC Medications - No data to display  Initial Impression / Assessment and Plan / UC Course  I have reviewed the triage vital signs and the nursing notes.  Pertinent labs & imaging results that were available during my care of the patient were reviewed by me and considered in my medical decision making (see chart for details).    Assessment negative for red flags or concerns.  Likely viral uri.  COVID test pending.  Discussed need to quarantine while waiting for results and for at least 5 days if test is positive.  Discussed conservative symptom management as described in discharge instructions.  Encouraged fluids and rest.  Follow up as needed.   Final Clinical Impressions(s) / UC Diagnoses   Final diagnoses:  Viral upper respiratory tract infection     Discharge Instructions      We will contact you if your COVID test is positive.  Please quarantine while you wait for the results.  If your test is negative you may resume normal activities.  If your test is positive please continue to quarantine for at least 5 days from your symptom onset or until you are without a fever for at least 24 hours after the medications.  You can take Tylenol and/or Ibuprofen as needed for fever reduction and pain relief.   For cough: honey 1/2 to 1 teaspoon (you can dilute the honey in water or another fluid).  You can also use guaifenesin and dextromethorphan for cough. You can use a humidifier for chest congestion and cough.  If you don't have a humidifier, you can sit in the bathroom with the hot shower running.     For sore throat: try warm salt water gargles, cepacol lozenges, throat spray, warm tea or water with lemon/honey, popsicles or ice, or OTC cold relief medicine for throat discomfort.    For congestion: take a daily anti-histamine like  Zyrtec, Claritin, and a oral decongestant, such as pseudoephedrine.  You can also use Flonase 1-2 sprays in each  nostril daily.    It is important to stay hydrated: drink plenty of fluids (water, gatorade/powerade/pedialyte, juices, or teas) to keep your throat moisturized and help further relieve irritation/discomfort.   Return or go to the Emergency Department if symptoms worsen or do not improve in the next few days.      ED Prescriptions   None    PDMP not reviewed this encounter.   Ivette Loyal, NP 02/21/21 [phone]

## 2021-02-21 NOTE — ED Triage Notes (Signed)
Pt is present today with night sweats, fever, body aches, fatigue, and chills. Pt states that his sx yesterday

## 2021-02-22 LAB — SARS CORONAVIRUS 2 (TAT 6-24 HRS): SARS Coronavirus 2: POSITIVE — AB

## 2022-12-12 IMAGING — DX DG CHEST 2V
2 series · 2 of 2 positions shown · non-contrast
Comparison: PA and lateral chest 07/26/2007.

CLINICAL DATA: Pain about the left scapula and left flank for 6
months. No known injury.

EXAM:
CHEST - 2 VIEW

[chest lat]
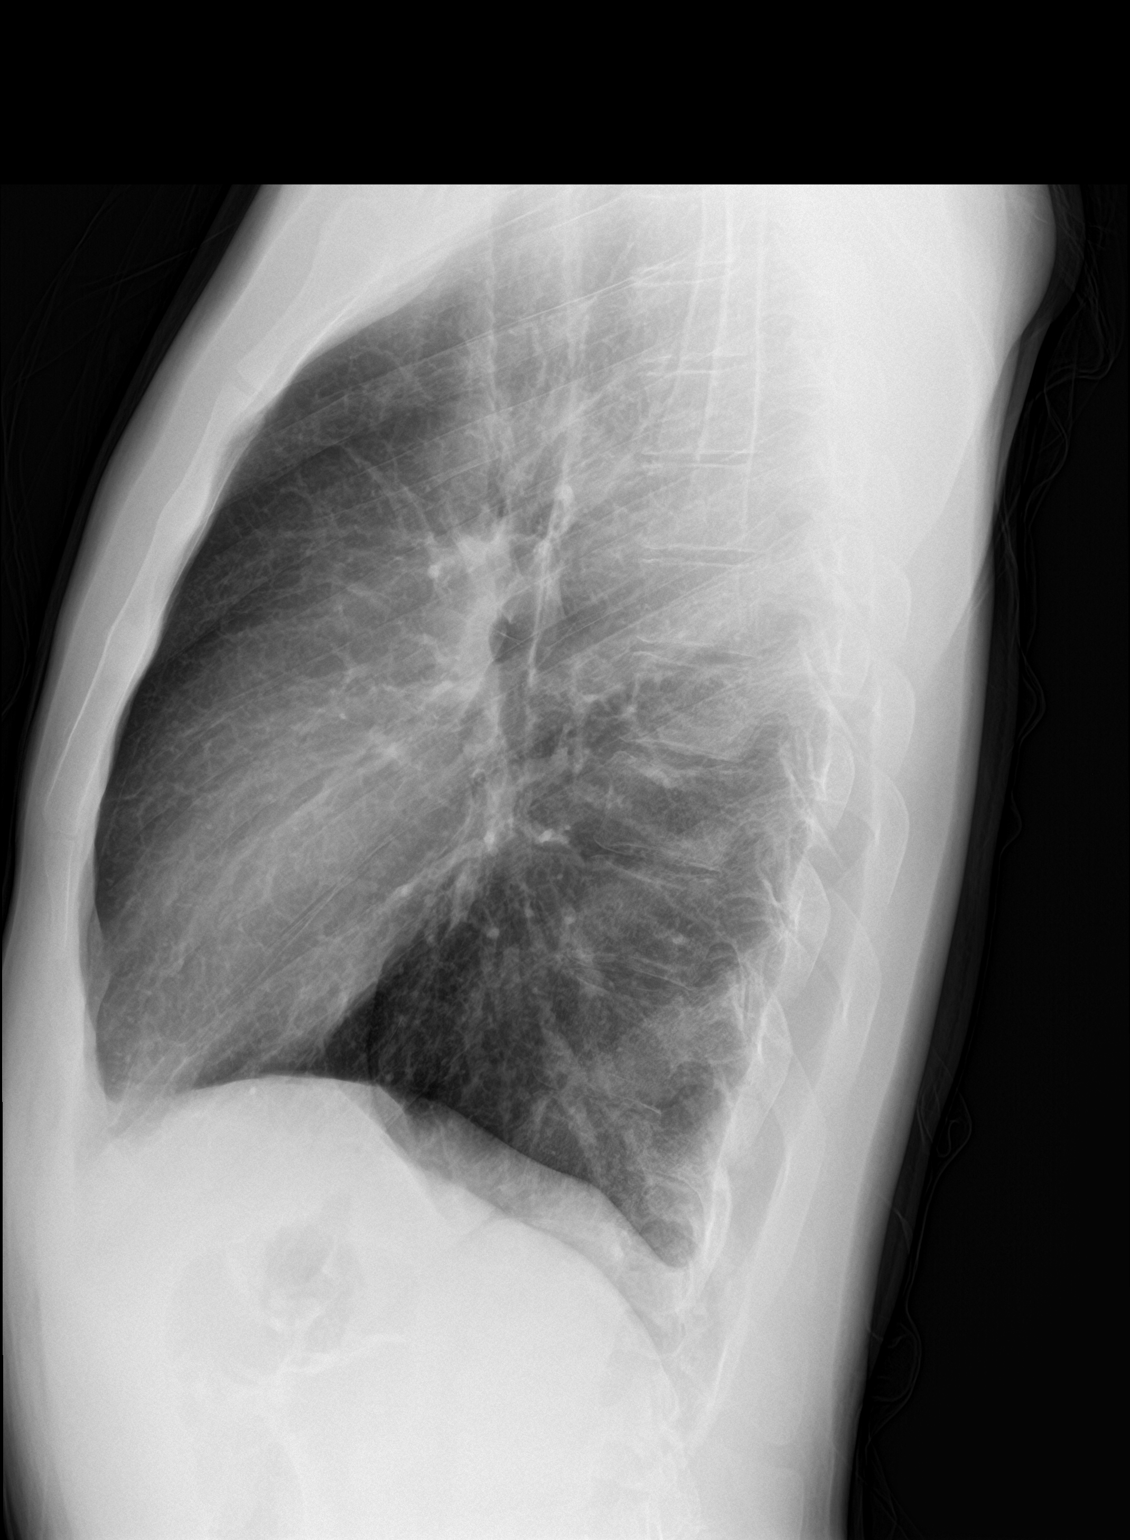

[chest pa]
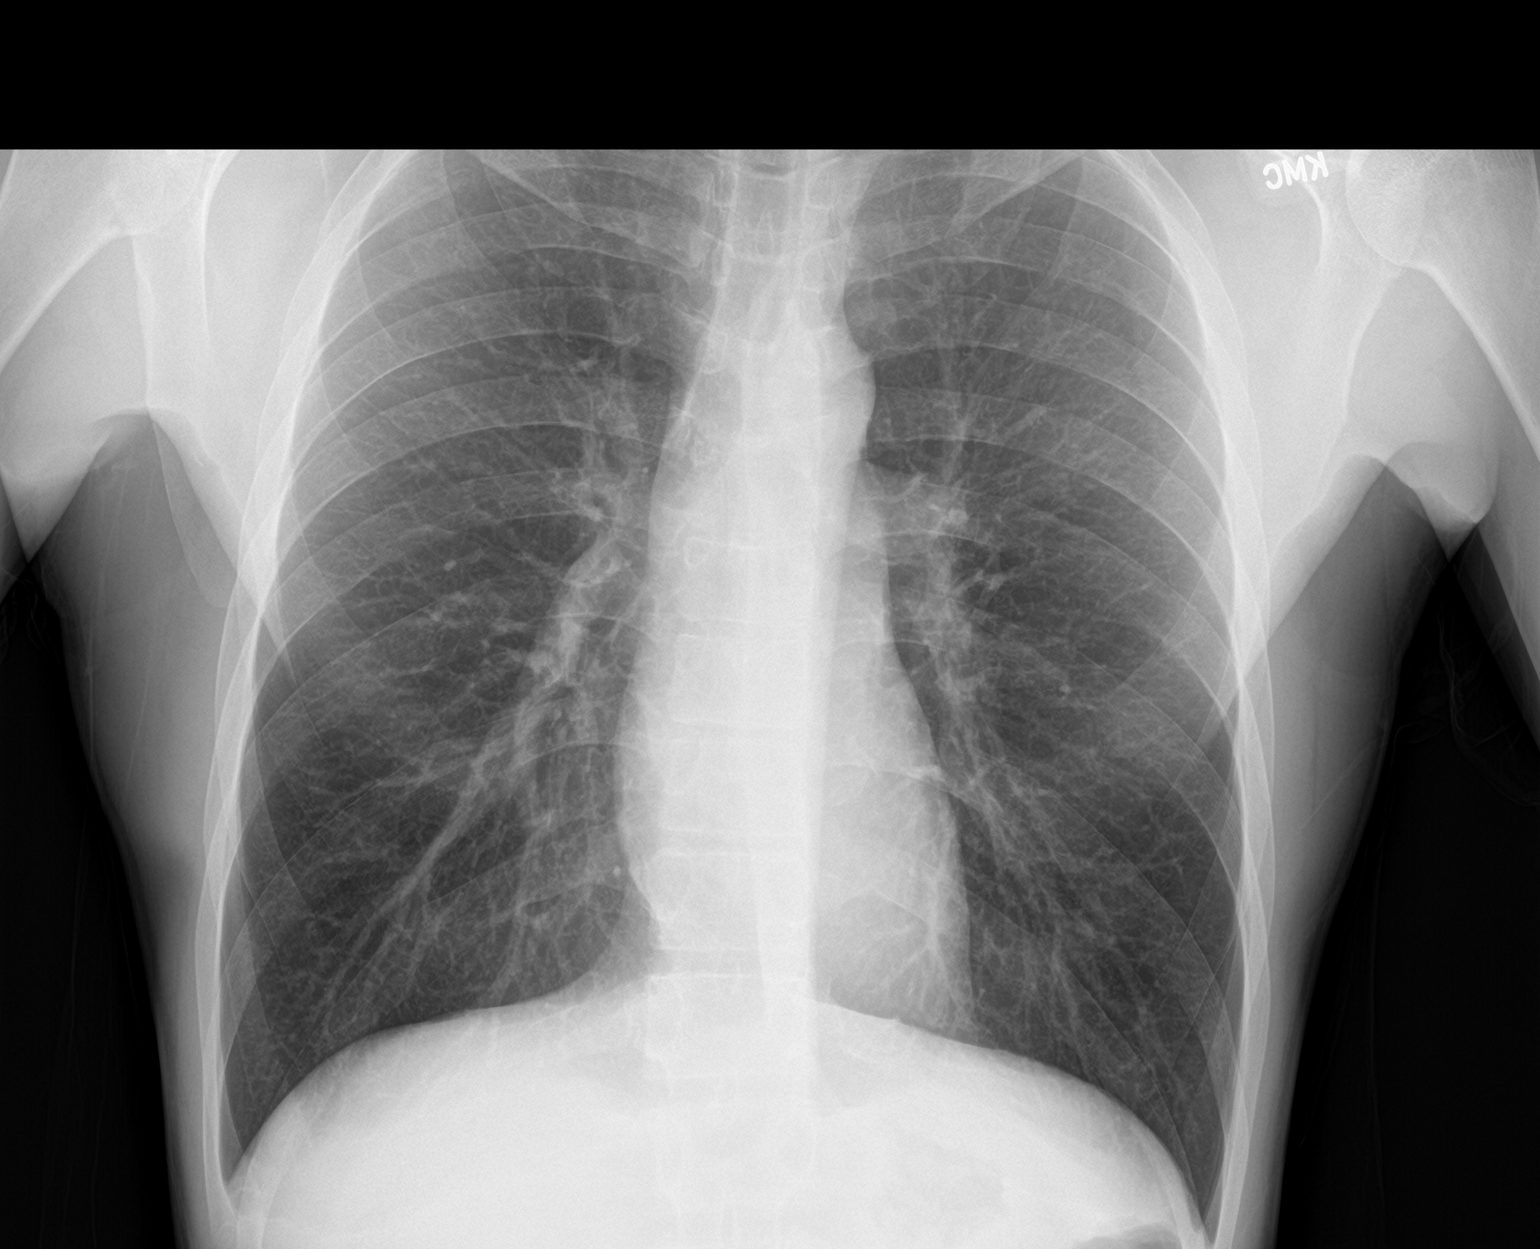

[2 of 2 positions shown; findings below may reference images not displayed]

FINDINGS: The lungs are clear. Heart size is normal. There is no pneumothorax
or pleural fluid. No bony abnormality.
IMPRESSION: Negative chest.

## 2023-08-06 ENCOUNTER — Encounter (HOSPITAL_COMMUNITY): Payer: Self-pay

## 2023-08-06 ENCOUNTER — Ambulatory Visit (HOSPITAL_COMMUNITY)
Admission: EM | Admit: 2023-08-06 | Discharge: 2023-08-06 | Disposition: A | Discharge status: Home / Self Care | Payer: 59 | Attending: Neurology | Admitting: Neurology

## 2023-08-06 DIAGNOSIS — B9789 Other viral agents as the cause of diseases classified elsewhere: Secondary | ICD-10-CM

## 2023-08-06 DIAGNOSIS — J988 Other specified respiratory disorders: Secondary | ICD-10-CM | POA: Diagnosis not present

## 2023-08-06 DIAGNOSIS — Z9109 Other allergy status, other than to drugs and biological substances: Secondary | ICD-10-CM | POA: Diagnosis not present

## 2023-08-06 MED ORDER — CETIRIZINE HCL 10 MG PO TABS: 10.0000 mg | ORAL_TABLET | Freq: Every day | ORAL | 1 refills | Status: AC

## 2023-08-06 MED ORDER — FLUTICASONE PROPIONATE 50 MCG/ACT NA SUSP: 2.0000 | Freq: Every day | NASAL | 2 refills | Status: AC

## 2023-08-06 NOTE — ED Triage Notes (Signed)
 Pt states fever and right ear and throat pain for the past 4 days. States he has been taking tylenol and nyquil at home.

## 2023-08-06 NOTE — Discharge Instructions (Addendum)
 Your symptoms are likely due to a respiratory virus and allergies.  Avoid exposure to allergens or smoke. Take oral antihistamine (Either Zyrtec , Claritin, or Allegra) and use Flonase  daily as directed. You can buy these medications over the counter. These medications can take a few days to fully kick in to your body and start working. Return for any new or worsening symptoms. May continue using over the counter Nyquil as needed   If your eyes become itchy, you may purchase olopatadine (Pataday) eyedrops over the counter and use as directed to relieve watery/itchy eyes associated with allergies as well.   If your symptoms are severe, please go to the emergency room for further evaluation. Schedule an appointment with your primary care provider for follow-up and further management of your seasonal allergies as well as ongoing preventive healthcare.

## 2023-08-06 NOTE — ED Provider Notes (Addendum)
 [facility]    CSN: [id number] Arrival date & time: 08/06/23  1451      History   Chief Complaint Chief Complaint  Patient presents with   Fever    HPI Henry Mcneil is a 38 y.o. male.   Initially felt like he had an ear infection Thursday and Friday. Took a nap Saturday which is unusual.  He had a temperature of 100.5 on Monday and took Tylenol which lowered his fever.  He has not had a fever today.  Denies nausea, vomiting, headache.  Endorses right ear pain and pain with swallowing.  He notes that his roommate came home sick over the weekend he does live with another person who has not been sick.   The history is provided by the patient.  Fever Max temp prior to arrival:  100.5 Temp source:  Oral Severity:  Mild Onset quality:  Sudden Timing:  Intermittent Progression:  Resolved   History reviewed. No pertinent past medical history.  There are no active problems to display for this patient.   Past Surgical History:  Procedure Laterality Date   LEG SURGERY         Home Medications    Prior to Admission medications   Medication Sig Start Date End Date Taking? Authorizing Provider  cetirizine  (ZYRTEC  ALLERGY) 10 MG tablet Take 1 tablet (10 mg total) by mouth daily. 08/06/23  Yes Glady Ouderkirk, Jorene, NP  fluticasone  (FLONASE ) 50 MCG/ACT nasal spray Place 2 sprays into both nostrils daily. 08/06/23  Yes Remi Jorene, NP  ibuprofen  (ADVIL ) 800 MG tablet Take 1 tablet (800 mg total) by mouth every 8 (eight) hours as needed. 10/05/20   Sofia, Leslie K, PA-C  methocarbamol  (ROBAXIN ) 500 MG tablet Take 1 tablet (500 mg total) by mouth 4 (four) times daily. 10/05/20   Flint Sonny POUR, PA-C    Family History Family History  Problem Relation Age of Onset   Healthy Mother    Heart failure Father     Social History Social History   Tobacco Use   Smoking status: Every Day    Current packs/day: 1.00    Types: Cigarettes   Smokeless tobacco: Never   Substance Use Topics   Alcohol use: Yes    Comment: occasional   Drug use: Not Currently    Types: Marijuana     Allergies   Patient has no known allergies.   Review of Systems Review of Systems  Constitutional:  Positive for fever.     Physical Exam Triage Vital Signs ED Triage Vitals  Encounter Vitals Group     BP 08/06/23 1520 138/84     Systolic BP Percentile --      Diastolic BP Percentile --      Pulse Rate 08/06/23 1520 (!) 101     Resp 08/06/23 1520 16     Temp 08/06/23 1520 98.3 F (36.8 C)     Temp Source 08/06/23 1520 Oral     SpO2 08/06/23 1520 98 %     Weight --      Height --      Head Circumference --      Peak Flow --      Pain Score 08/06/23 1521 10     Pain Loc --      Pain Education --      Exclude from Growth Chart --    No data found.  Updated Vital Signs BP 138/84 (BP Location: Right Arm)   Pulse (!) 101  Temp 98.3 F (36.8 C) (Oral)   Resp 16   SpO2 98%   Visual Acuity Right Eye Distance:   Left Eye Distance:   Bilateral Distance:    Right Eye Near:   Left Eye Near:    Bilateral Near:     Physical Exam Constitutional:      Appearance: Normal appearance. He is normal weight.  HENT:     Right Ear: Hearing, ear canal and external ear normal. No middle ear effusion. Tympanic membrane is not injected, erythematous, retracted or bulging.     Left Ear: Tympanic membrane normal.     Nose: Nose normal.     Mouth/Throat:     Mouth: Mucous membranes are moist.     Pharynx: Oropharynx is clear.  Eyes:     Pupils: Pupils are equal, round, and reactive to light.  Cardiovascular:     Rate and Rhythm: Regular rhythm. Tachycardia present.     Pulses: Normal pulses.  Pulmonary:     Effort: Pulmonary effort is normal.     Breath sounds: Normal breath sounds.  Abdominal:     General: Abdomen is flat.     Palpations: Abdomen is soft.  Musculoskeletal:        General: Normal range of motion.     Cervical back: Normal range of  motion.  Skin:    General: Skin is warm and dry.     Capillary Refill: Capillary refill takes less than 2 seconds.  Neurological:     General: No focal deficit present.     Mental Status: He is alert.  Psychiatric:        Mood and Affect: Mood normal.        Behavior: Behavior normal.      UC Treatments / Results  Labs (all labs ordered are listed, but only abnormal results are displayed) Labs Reviewed - No data to display  EKG   Radiology No results found.  Procedures Procedures (including critical care time)  Medications Ordered in UC Medications - No data to display  Initial Impression / Assessment and Plan / UC Course  I have reviewed the triage vital signs and the nursing notes.  Pertinent labs & imaging results that were available during my care of the patient were reviewed by me and considered in my medical decision making (see chart for details).  Symptoms due to sinusitis  Discussed supportive care with OTC antihistamine, Flonase  daily. May continue tylenol and ibuprofen  as needed  Low suspicion for viral URI, therefore deferred testing. Lungs clear, vitals hemodynamically stable, therefore deferred imaging of the chest.      Final Clinical Impressions(s) / UC Diagnoses   Final diagnoses:  Viral respiratory illness     Discharge Instructions      Your symptoms are likely due to a respiratory virus and allergies.  Avoid exposure to allergens or smoke. Take oral antihistamine (Either Zyrtec , Claritin, or Allegra) and use Flonase  daily as directed. You can buy these medications over the counter. These medications can take a few days to fully kick in to your body and start working. Return for any new or worsening symptoms. May continue using over the counter Nyquil as needed   If your eyes become itchy, you may purchase olopatadine (Pataday) eyedrops over the counter and use as directed to relieve watery/itchy eyes associated with allergies as well.    If your symptoms are severe, please go to the emergency room for further evaluation. Schedule an appointment with your primary  care provider for follow-up and further management of your seasonal allergies as well as ongoing preventive healthcare.      ED Prescriptions     Medication Sig Dispense Auth. Provider   fluticasone  (FLONASE ) 50 MCG/ACT nasal spray Place 2 sprays into both nostrils daily. 15.8 mL Remi Pippin, NP   cetirizine  (ZYRTEC  ALLERGY) 10 MG tablet Take 1 tablet (10 mg total) by mouth daily. 30 tablet Remi Pippin, NP      PDMP not reviewed this encounter.   Remi Pippin, NP 08/06/23 1602    Remi Pippin, NP 08/06/23 [phone]

## 2023-08-07 ENCOUNTER — Encounter (HOSPITAL_COMMUNITY): Payer: Self-pay

## 2023-08-07 ENCOUNTER — Emergency Department (HOSPITAL_COMMUNITY)
Admission: EM | Admit: 2023-08-07 | Discharge: 2023-08-07 | Disposition: A | Discharge status: Home / Self Care | Payer: 59 | Attending: Emergency Medicine | Admitting: Emergency Medicine

## 2023-08-07 ENCOUNTER — Other Ambulatory Visit: Payer: Self-pay

## 2023-08-07 DIAGNOSIS — Z20822 Contact with and (suspected) exposure to covid-19: Secondary | ICD-10-CM | POA: Insufficient documentation

## 2023-08-07 DIAGNOSIS — H9209 Otalgia, unspecified ear: Secondary | ICD-10-CM | POA: Diagnosis not present

## 2023-08-07 DIAGNOSIS — J029 Acute pharyngitis, unspecified: Secondary | ICD-10-CM | POA: Diagnosis not present

## 2023-08-07 DIAGNOSIS — R609 Edema, unspecified: Secondary | ICD-10-CM | POA: Diagnosis not present

## 2023-08-07 DIAGNOSIS — H9201 Otalgia, right ear: Secondary | ICD-10-CM | POA: Insufficient documentation

## 2023-08-07 DIAGNOSIS — J00 Acute nasopharyngitis [common cold]: Secondary | ICD-10-CM | POA: Diagnosis not present

## 2023-08-07 DIAGNOSIS — J020 Streptococcal pharyngitis: Secondary | ICD-10-CM | POA: Insufficient documentation

## 2023-08-07 LAB — RESP PANEL BY RT-PCR (RSV, FLU A&B, COVID)  RVPGX2
Influenza A by PCR: NEGATIVE
Influenza B by PCR: NEGATIVE
Resp Syncytial Virus by PCR: NEGATIVE
SARS Coronavirus 2 by RT PCR: NEGATIVE

## 2023-08-07 LAB — GROUP A STREP BY PCR: Group A Strep by PCR: DETECTED — AB

## 2023-08-07 MED ORDER — PENICILLIN V POTASSIUM 250 MG PO TABS
500.0000 mg | ORAL_TABLET | Freq: Once | ORAL | Status: AC
  Administered 2023-08-07: 500 mg via ORAL
  Filled 2023-08-07: qty 2

## 2023-08-07 MED ORDER — PENICILLIN V POTASSIUM 500 MG PO TABS: 500.0000 mg | ORAL_TABLET | Freq: Two times a day (BID) | ORAL | 0 refills | Status: AC

## 2023-08-07 NOTE — ED Triage Notes (Addendum)
 Pt arrives via [facility] from home. Pt reports sore throat and pain in right ear for the past 5 days. Pt was seen at [facility] yesterday. Pt AxOx4.

## 2023-08-07 NOTE — ED Provider Notes (Signed)
 [facility] Provider Note   CSN: [id number] Arrival date & time: 08/07/23  1127     History  Chief Complaint  Patient presents with   Sore Throat   Otalgia    Henry Mcneil is a 38 y.o. male.  Who presents to ED for throat.  5 days of sore throat and right ear pain.  Fevers of 100.5 at home.  Tylenol has been ineffective.  Pain with swallowing.  No shortness of breath nausea vomiting or GI symptoms.   Sore Throat  Otalgia      Home Medications Prior to Admission medications   Medication Sig Start Date End Date Taking? Authorizing Provider  penicillin  v potassium (VEETID) 500 MG tablet Take 1 tablet (500 mg total) by mouth 2 (two) times daily for 10 days. 08/07/23 08/17/23 Yes Sallyanne Creamer, DO  cetirizine  (ZYRTEC  ALLERGY) 10 MG tablet Take 1 tablet (10 mg total) by mouth daily. 08/06/23   Shafer, Devon, NP  fluticasone  (FLONASE ) 50 MCG/ACT nasal spray Place 2 sprays into both nostrils daily. 08/06/23   Imogene Mana, NP  ibuprofen  (ADVIL ) 800 MG tablet Take 1 tablet (800 mg total) by mouth every 8 (eight) hours as needed. 10/05/20   Sofia, Leslie K, PA-C  methocarbamol  (ROBAXIN ) 500 MG tablet Take 1 tablet (500 mg total) by mouth 4 (four) times daily. 10/05/20   Sofia, Leslie K, PA-C      Allergies    Patient has no known allergies.    Review of Systems   Review of Systems  HENT:  Positive for ear pain.     Physical Exam Updated Vital Signs BP (!) 121/92 (BP Location: Right Arm)   Pulse 100   Temp 98.9 F (37.2 C)   Resp 18   Ht 6' (1.829 m)   Wt 61.2 kg   SpO2 100%   BMI 18.31 kg/m  Physical Exam Vitals and nursing note reviewed.  HENT:     Head: Normocephalic and atraumatic.     Right Ear: A middle ear effusion is present.     Left Ear: Tympanic membrane normal.     Mouth/Throat:     Pharynx: Posterior oropharyngeal erythema present. No oropharyngeal exudate.  Eyes:     Pupils: Pupils are equal, round, and  reactive to light.  Cardiovascular:     Rate and Rhythm: Normal rate and regular rhythm.  Pulmonary:     Effort: Pulmonary effort is normal.     Breath sounds: Normal breath sounds.  Abdominal:     Palpations: Abdomen is soft.     Tenderness: There is no abdominal tenderness.  Skin:    General: Skin is warm and dry.  Neurological:     Mental Status: He is alert.  Psychiatric:        Mood and Affect: Mood normal.     ED Results / Procedures / Treatments   Labs (all labs ordered are listed, but only abnormal results are displayed) Labs Reviewed  GROUP A STREP BY PCR - Abnormal; Notable for the following components:      Result Value   Group A Strep by PCR DETECTED (*)    All other components within normal limits  RESP PANEL BY RT-PCR (RSV, FLU A&B, COVID)  [id number]    EKG None  Radiology No results found.  Procedures Procedures    Medications Ordered in ED Medications  penicillin  v potassium (VEETID) tablet 500 mg (500 mg Oral Given 08/07/23 1404)  ED Course/ Medical Decision Making/ A&P                                 Medical Decision Making 38 year old male otherwise healthy presenting for sore throat and right-sided ear pain.  Exam notable for pharyngeal erythema and right TM effusion.  Rapid strep positive.  Will give first dose of penicillin  vee 500 mg p.o. provide 10-day course.  Return precautions discussed in detail  Risk Prescription drug management.           Final Clinical Impression(s) / ED Diagnoses Final diagnoses:  Strep pharyngitis  Right ear pain    Rx / DC Orders ED Discharge Orders          Ordered    penicillin  v potassium (VEETID) 500 MG tablet  2 times daily        08/07/23 1401              Rafael Bun A, DO 08/07/23 1405

## 2023-08-07 NOTE — Discharge Instructions (Signed)
 Diagnosis and Treatment: You have been diagnosed with strep throat, a bacterial infection caused by group A Streptococcus. The recommended treatment is a 10-day course of antibiotics. You have been prescribed:  Penicillin  V: 500 mg taken orally twice daily for 10 days, or  Medication Instructions:  Take your antibiotics exactly as prescribed. Do not stop taking them early, even if you feel better.  If you experience any side effects, such as rash, difficulty breathing, or severe diarrhea, contact your doctor immediately. Symptom Management:  For pain and fever, you can take acetaminophen or ibuprofen  as needed. Avoid aspirin.[1]  Drink plenty of fluids and rest as much as possible.  Gargle with warm salt water to soothe your throat. Infection Control:  You are contagious until you have been on antibiotics for at least 24 hours. Avoid close contact with others during this time.  Wash your hands frequently and avoid sharing utensils or personal items. Follow-Up:  If your symptoms do not improve within 48 hours of starting antibiotics, or if they worsen, contact your doctor.  Complete the full course of antibiotics to prevent complications such as rheumatic fever.[2] Return to Work/School:  You can return to work or school after you have been on antibiotics for at least 24 hours and are feeling better.[3] Emergency:  If you experience difficulty breathing, swelling of the face or throat, or severe pain, seek emergency medical attention immediately.

## 2023-11-17 ENCOUNTER — Encounter (HOSPITAL_COMMUNITY): Payer: Self-pay | Admitting: Emergency Medicine

## 2023-11-17 ENCOUNTER — Ambulatory Visit (HOSPITAL_COMMUNITY)
Admission: EM | Admit: 2023-11-17 | Discharge: 2023-11-17 | Disposition: A | Discharge status: Home / Self Care | Attending: Emergency Medicine | Admitting: Emergency Medicine

## 2023-11-17 ENCOUNTER — Other Ambulatory Visit: Payer: Self-pay

## 2023-11-17 DIAGNOSIS — S025XXA Fracture of tooth (traumatic), initial encounter for closed fracture: Secondary | ICD-10-CM

## 2023-11-17 DIAGNOSIS — K0889 Other specified disorders of teeth and supporting structures: Secondary | ICD-10-CM | POA: Diagnosis not present

## 2023-11-17 MED ORDER — IBUPROFEN 800 MG PO TABS: 800.0000 mg | ORAL_TABLET | Freq: Three times a day (TID) | ORAL | 0 refills | Status: AC

## 2023-11-17 MED ORDER — NYSTATIN 100000 UNIT/ML MT SUSP: 5.0000 mL | Freq: Three times a day (TID) | OROMUCOSAL | 0 refills | Status: AC

## 2023-11-17 MED ORDER — AMOXICILLIN-POT CLAVULANATE 875-125 MG PO TABS: 1.0000 | ORAL_TABLET | Freq: Two times a day (BID) | ORAL | 0 refills | Status: AC

## 2023-11-17 NOTE — ED Triage Notes (Signed)
 Left upper dental pain that started today with some bleeding on his gum.

## 2023-11-17 NOTE — ED Provider Notes (Signed)
 [facility]    CSN: [id number] Arrival date & time: 11/17/23  1048      History   Chief Complaint Chief Complaint  Patient presents with   Dental Pain    HPI Henry Mcneil is a 38 y.o. male.   Patient presents with left lower dental pain that started today.  Patient states that he was brushing his teeth and also noticed some bleeding of his gums.  Patient states he does not remember eating anything hard or any known injury, but states he thinks he may have chipped his tooth.  Patient denies having a dentist at this time.  Patient denies taking anything for pain.  The history is provided by the patient and medical records.  Dental Pain   History reviewed. No pertinent past medical history.  There are no active problems to display for this patient.   Past Surgical History:  Procedure Laterality Date   LEG SURGERY         Home Medications    Prior to Admission medications   Medication Sig Start Date End Date Taking? Authorizing Provider  amoxicillin-clavulanate (AUGMENTIN) 875-125 MG tablet Take 1 tablet by mouth every 12 (twelve) hours. 11/17/23  Yes Rosevelt Constable, Glenda Spelman A, NP  ibuprofen  (ADVIL ) 800 MG tablet Take 1 tablet (800 mg total) by mouth 3 (three) times daily. 11/17/23  Yes Levora Reas A, NP  magic mouthwash (nystatin, lidocaine , diphenhydrAMINE, alum & mag hydroxide) suspension Swish and spit 5 mLs 3 (three) times daily. 11/17/23  Yes Rosevelt Constable, Levert Heslop A, NP  cetirizine  (ZYRTEC  ALLERGY) 10 MG tablet Take 1 tablet (10 mg total) by mouth daily. 08/06/23   Imogene Mana, NP  fluticasone  (FLONASE ) 50 MCG/ACT nasal spray Place 2 sprays into both nostrils daily. 08/06/23   Imogene Mana, NP  methocarbamol  (ROBAXIN ) 500 MG tablet Take 1 tablet (500 mg total) by mouth 4 (four) times daily. 10/05/20   Sandi Crosby, PA-C    Family History Family History  Problem Relation Age of Onset   Healthy Mother    Heart failure Father     Social  History Social History   Tobacco Use   Smoking status: Every Day    Current packs/day: 1.00    Types: Cigarettes   Smokeless tobacco: Never  Substance Use Topics   Alcohol use: Yes    Comment: occasional   Drug use: Not Currently    Types: Marijuana     Allergies   Patient has no known allergies.   Review of Systems Review of Systems  Per HPI  Physical Exam Triage Vital Signs ED Triage Vitals  Encounter Vitals Group     BP 11/17/23 1116 117/85     Systolic BP Percentile --      Diastolic BP Percentile --      Pulse Rate 11/17/23 1116 82     Resp 11/17/23 1116 18     Temp 11/17/23 1116 98.3 F (36.8 C)     Temp Source 11/17/23 1116 Oral     SpO2 11/17/23 1116 98 %     Weight --      Height --      Head Circumference --      Peak Flow --      Pain Score 11/17/23 1117 8     Pain Loc --      Pain Education --      Exclude from Growth Chart --    No data found.  Updated Vital Signs BP 117/85 (BP  Location: Right Arm)   Pulse 82   Temp 98.3 F (36.8 C) (Oral)   Resp 18   SpO2 98%   Visual Acuity Right Eye Distance:   Left Eye Distance:   Bilateral Distance:    Right Eye Near:   Left Eye Near:    Bilateral Near:     Physical Exam Vitals and nursing note reviewed.  Constitutional:      General: He is awake. He is not in acute distress.    Appearance: Normal appearance. He is well-developed and well-groomed. He is not ill-appearing.  HENT:     Mouth/Throat:     Mouth: Mucous membranes are moist.     Dentition: Abnormal dentition. Dental tenderness present. No gingival swelling or dental abscesses.   Neurological:     Mental Status: He is alert.  Psychiatric:        Behavior: Behavior is cooperative.      UC Treatments / Results  Labs (all labs ordered are listed, but only abnormal results are displayed) Labs Reviewed - No data to display  EKG   Radiology No results found.  Procedures Procedures (including critical care  time)  Medications Ordered in UC Medications - No data to display  Initial Impression / Assessment and Plan / UC Course  I have reviewed the triage vital signs and the nursing notes.  Pertinent labs & imaging results that were available during my care of the patient were reviewed by me and considered in my medical decision making (see chart for details).     Patient is well-appearing and vitals are stable.  Chipped tooth noted on exam, without gingival swelling or evidence of dental infection.  Prescribed Magic mouthwash to help with inflammation and mild pain.  Prescribed ibuprofen  for pain.  Patient's mother requested prescription for antibiotic for infection coverage, stating that last time he had a chipped tooth he developed an infection and had to come back to get an antibiotic.  Prescribed Augmentin for infection coverage.  Given dental resources to follow-up with.  Discussed return precautions. Final Clinical Impressions(s) / UC Diagnoses   Final diagnoses:  Pain, dental  Closed fracture of tooth, initial encounter     Discharge Instructions      Start using Magic mouthwash 3 times daily to help with inflammation and mouth pain. You can take any other milligram ibuprofen  every 8 hours as needed for pain.  Otherwise take 650 mg of Tylenol every 6-8 hours for breakthrough pain. If you start to notice any significant swelling you can start to take the Augmentin twice daily for infection coverage I have attached a list of dental resources that you can follow-up with regarding further management of your dental pain and chipped tooth. Return here as needed.   ED Prescriptions     Medication Sig Dispense Auth. Provider   amoxicillin-clavulanate (AUGMENTIN) 875-125 MG tablet Take 1 tablet by mouth every 12 (twelve) hours. 14 tablet Levora Reas A, NP   magic mouthwash (nystatin, lidocaine , diphenhydrAMINE, alum & mag hydroxide) suspension Swish and spit 5 mLs 3 (three) times  daily. 180 mL Levora Reas A, NP   ibuprofen  (ADVIL ) 800 MG tablet Take 1 tablet (800 mg total) by mouth 3 (three) times daily. 21 tablet Levora Reas A, NP      PDMP not reviewed this encounter.   Levora Reas A, NP 11/17/23 [phone]

## 2023-11-17 NOTE — Discharge Instructions (Addendum)
 Start using Magic mouthwash 3 times daily to help with inflammation and mouth pain. You can take any other milligram ibuprofen  every 8 hours as needed for pain.  Otherwise take 650 mg of Tylenol every 6-8 hours for breakthrough pain. If you start to notice any significant swelling you can start to take the Augmentin twice daily for infection coverage I have attached a list of dental resources that you can follow-up with regarding further management of your dental pain and chipped tooth. Return here as needed.
